# Patient Record
Sex: Male | Born: 1970
Health system: Southern US, Community
[De-identification: ages and names within clinical notes are randomized; demographics above are authoritative.]

## PROBLEM LIST (undated history)

## (undated) DIAGNOSIS — T7840XA Allergy, unspecified, initial encounter: Secondary | ICD-10-CM

## (undated) DIAGNOSIS — G709 Myoneural disorder, unspecified: Secondary | ICD-10-CM

## (undated) DIAGNOSIS — M1990 Unspecified osteoarthritis, unspecified site: Secondary | ICD-10-CM

## (undated) HISTORY — DX: Allergy, unspecified, initial encounter: T78.40XA

## (undated) HISTORY — DX: Myoneural disorder, unspecified: G70.9

## (undated) HISTORY — DX: Unspecified osteoarthritis, unspecified site: M19.90

## (undated) HISTORY — PX: VASECTOMY: SHX75

---

## 1989-06-26 HISTORY — PX: KNEE ARTHROSCOPY: SUR90

## 2016-10-31 ENCOUNTER — Ambulatory Visit: Payer: Self-pay | Admitting: Podiatry

## 2016-12-14 DIAGNOSIS — M25511 Pain in right shoulder: Secondary | ICD-10-CM | POA: Diagnosis not present

## 2016-12-14 DIAGNOSIS — M797 Fibromyalgia: Secondary | ICD-10-CM | POA: Diagnosis not present

## 2016-12-15 MED FILL — GABAPENTIN 300 MG CAPSULE: 300 | 30 days supply | Qty: 60 | Fill #0

## 2016-12-26 DIAGNOSIS — S61201A Unspecified open wound of left index finger without damage to nail, initial encounter: Secondary | ICD-10-CM | POA: Diagnosis not present

## 2016-12-26 DIAGNOSIS — S61203A Unspecified open wound of left middle finger without damage to nail, initial encounter: Secondary | ICD-10-CM | POA: Diagnosis not present

## 2017-01-05 DIAGNOSIS — S61201D Unspecified open wound of left index finger without damage to nail, subsequent encounter: Secondary | ICD-10-CM | POA: Diagnosis not present

## 2017-01-05 DIAGNOSIS — S61203A Unspecified open wound of left middle finger without damage to nail, initial encounter: Secondary | ICD-10-CM | POA: Diagnosis not present

## 2017-03-02 ENCOUNTER — Ambulatory Visit (HOSPITAL_COMMUNITY)
Admission: EM | Admit: 2017-03-02 | Discharge: 2017-03-02 | Disposition: A | Discharge status: Home / Self Care | Payer: 59 | Attending: Emergency Medicine | Admitting: Emergency Medicine

## 2017-03-02 ENCOUNTER — Encounter (HOSPITAL_COMMUNITY): Payer: Self-pay | Admitting: *Deleted

## 2017-03-02 ENCOUNTER — Ambulatory Visit (INDEPENDENT_AMBULATORY_CARE_PROVIDER_SITE_OTHER): Payer: 59

## 2017-03-02 DIAGNOSIS — M25511 Pain in right shoulder: Secondary | ICD-10-CM

## 2017-03-02 DIAGNOSIS — G8929 Other chronic pain: Secondary | ICD-10-CM

## 2017-03-02 MED ORDER — DICLOFENAC SODIUM 75 MG PO TBEC: 75.0000 mg | DELAYED_RELEASE_TABLET | Freq: Two times a day (BID) | ORAL | 0 refills | Status: DC

## 2017-03-02 MED FILL — DICLOFENAC SODIUM 75 MG TAB: 75 | 15 days supply | Qty: 30 | Fill #0

## 2017-03-02 NOTE — ED Provider Notes (Signed)
HPI  SUBJECTIVE:  Maurice Anderson is a 46 y.o. male who presents with waxing and waning right shoulder pain since May. Thinks that it started after playing with his daughter, throwing her up into the air. He notes a knot on his right shoulder. He describes this pain as stabbing, sharp, intermittent, lasting hours states that it flares from time to time after use. He denies trauma. He denies any grip weakness, arm weakness, distal numbness or tingling, bruising, erythema, fevers, sensation of instability. He saw his primary care physician, received a steroid injection and has tried resting his shoulder. States that he has not had any imaging thus far. Symptoms are better with rest. Symptoms are worse with urinating seeing his shoulder for prolonged period of time, abduction past 90. No previous history right shoulder injury, kidney disease, diabetes, hypertension. ZOX:WRUEAVW, Loraine Leriche, DO     History reviewed. No pertinent past medical history.  History reviewed. No pertinent surgical history.  History reviewed. No pertinent family history.  Social History  Substance Use Topics  . Smoking status: Current Every Day Smoker  . Smokeless tobacco: Never Used  . Alcohol use No    No current facility-administered medications for this encounter.   Current Outpatient Prescriptions:  .  diclofenac (VOLTAREN) 75 MG EC tablet, Take 1 tablet (75 mg total) by mouth 2 (two) times daily. Take with food, Disp: 30 tablet, Rfl: 0  No Known Allergies   ROS  As noted in HPI.   Physical Exam  BP (!) 143/94 (BP Location: Left Arm)   Pulse (!) 58   Temp 98 F (36.7 C) (Oral)   Resp 16   SpO2 100%   Constitutional: Well developed, well nourished, no acute distress Eyes:  EOMI, conjunctiva normal bilaterally HENT: Normocephalic, atraumatic,mucus membranes moist Respiratory: Normal inspiratory effort Cardiovascular: Normal rate GI: nondistended skin: No rash, skin intact Musculoskeletal:  R  shoulder with ROM normal, Drop test normal , clavicle NT, A/C joint tender And very prominent compared to the other one, proximal humerus NT, , Motor strength normal, Sensation intact LT over deltoid region, distal NVI with hand on affected side having grossly intact sensation and strength in the distribution of the median, radial, and ulnar nerve.  no pain with internal rotation, pain with external rotation, negative tenderness in bicipital groove, positive empty can test , negative liftoff test,  negative "popeye" sign, no instability with abduction/external rotation. Neurologic: Alert & oriented x 3, no focal neuro deficits Psychiatric: Speech and behavior appropriate   ED Course   Medications - No data to display  Orders Placed This Encounter  Procedures  . DG Shoulder Right    Standing Status:   Standing    Number of Occurrences:   1    Order Specific Question:   Reason for Exam (SYMPTOM  OR DIAGNOSIS REQUIRED)    Answer:   r shoulder pain x 6-8 weeks    No results found for this or any previous visit (from the past 24 hour(s)). Dg Shoulder Right  Result Date: 03/02/2017 CLINICAL DATA:  Initial evaluation for right shoulder pain for 6-8 weeks. EXAM: RIGHT SHOULDER - 2+ VIEW COMPARISON:  None. FINDINGS: There is no evidence of fracture or dislocation. There is no evidence of arthropathy or other focal bone abnormality. Soft tissues are unremarkable. IMPRESSION: Normal radiograph of the right shoulder. Electronically Signed   By: Rise Mu M.D.   On: 03/02/2017 14:25    ED Clinical Impression  Chronic right shoulder pain  ED Assessment/Plan  We'll get imaging to rule out any acute changes, however, favor A/C joint arthritis/pain. Doubt AC joint separation. If x-ray is negative, we'll refer to Dr. Rennis ChrisSupple, orthopedic surgeon., Diclofenac 75 mg twice a day with 1 g of Tylenol. Ice shoulder after use.  Imaging independently reviewed. Normal shoulder. See radiology report  for details.  Plan as above.  Discussed  imaging, MDM, plan and followup with patient. Patient agrees with plan.   Meds ordered this encounter  Medications  . DISCONTD: naproxen sodium (ANAPROX) 220 MG tablet    Sig: Take 220 mg by mouth 2 (two) times daily with a meal.  . diclofenac (VOLTAREN) 75 MG EC tablet    Sig: Take 1 tablet (75 mg total) by mouth 2 (two) times daily. Take with food    Dispense:  30 tablet    Refill:  0    *This clinic note was created using Scientist, clinical (histocompatibility and immunogenetics)Dragon dictation software. Therefore, there may be occasional mistakes despite careful proofreading.  ?   Domenick GongMortenson, Kaily Wragg, MD 03/03/17 [phone]

## 2017-03-02 NOTE — Discharge Instructions (Signed)
That the diclofenac twice a day with 1 g of Tylenol. This is a very effective combination for pain. Follow-up with Dr. Rennis ChrisSupple.

## 2017-03-02 NOTE — ED Triage Notes (Signed)
Patient reports right shoulder pain x 6-8 weeks. States saw PCP and was given steriod shot but did not relieve the pain. Patient reports there is a knot in shoulder now. Would like xray done, was not done at PCP. Denies any injury.

## 2017-04-17 NOTE — Progress Notes (Signed)
Maurice ScaleZach Jaree Anderson D.O. [facility] [address] [place], [place] [address] Phone: [phone] Subjective:    I'm seeing this patient by the request  of:  Lorelei PontMahoney, Mark, DO   CC: Right shoulder pain  BJY:NWGNFAOZHYHPI:Subjective  Maurice RiggsMichael Anderson is a 46 y.o. male coming in with complaint of right shoulder pain. Patient is had this pain since May. Started playing with his daughter when he lifted her up in the air and felt an audible pop and pain. Has a sharp, stabbing sensation that seems to occur in last hours. Denies any loss of strength or any arm weakness or any numbness. Patient states that he saw his primary care physician and was given an injection at one point. Seems to get better with rest. Rates the severity of pain a 7 out of 10  Patient did have a x-ray done on 03/02/2017. This was independently visualized by me. No bony abnormality.  Says he has a knot on the top of his shoulder. Taking his shirt off, pulling, and putting his hand behind his back is painful. Certain movements are painful. Pain is in the anterior shoulder. He says he usually braces his shoulder when it gets tired. Says he feels a burning feeling.    No past medical history on file. No past surgical history on file. Social History   Social History  . Marital status: Married    Spouse name: N/A  . Number of children: N/A  . Years of education: N/A   Social History Main Topics  . Smoking status: Current Every Day Smoker  . Smokeless tobacco: Never Used  . Alcohol use No  . Drug use: Unknown  . Sexual activity: Not on file   Other Topics Concern  . Not on file   Social History Narrative  . No narrative on file   No Known Allergies No family history on file.   Past medical history, social, surgical and family history all reviewed in electronic medical record.  No pertanent information unless stated regarding to the chief complaint.   Review of Systems:Review of systems updated and as accurate as of  04/17/17  No headache, visual changes, nausea, vomiting, diarrhea, constipation, dizziness, abdominal pain, skin rash, fevers, chills, night sweats, weight loss, swollen lymph nodes, body aches, joint swelling, muscle aches, chest pain, shortness of breath, mood changes.   Objective  There were no vitals taken for this visit. Systems examined below as of 04/17/17   General: No apparent distress alert and oriented x3 mood and affect normal, dressed appropriately.  HEENT: Pupils equal, extraocular movements intact  Respiratory: Patient's speak in full sentences and does not appear short of breath  Cardiovascular: No lower extremity edema, non tender, no erythema  Skin: Warm dry intact with no signs of infection or rash on extremities or on axial skeleton.  Abdomen: Soft nontender  Neuro: Cranial nerves II through XII are intact, neurovascularly intact in all extremities with 2+ DTRs and 2+ pulses.  Lymph: No lymphadenopathy of posterior or anterior cervical chain or axillae bilaterally.  Gait normal with good balance and coordination.  MSK:  Non tender with full range of motion and good stability and symmetric strength and tone of  elbows, wrist, hip, knee and ankles bilaterally.  Shoulder: Right Inspection reveals no abnormalities, atrophy or asymmetry. Palpation shows diffuse  ROM is full in all planes passively. Rotator cuff strength normal throughout. signs of impingement with positive Neer and Hawkin's tests, but negative empty can sign. Positive crossover Speeds  and Yergason's tests normal. No labral pathology noted with negative Obrien's, negative clunk and good stability. Normal scapular function observed. No painful arc and no drop arm sign. No apprehension sign  MSK US performed of: Right This study was ordered, performed, and interpreted by Terrilee Files D.O.  Shoulder:   Supraspinatus:  Rotator cuff tear noted. Scar tissue formation noted. Mild increase in Doppler  flow Subscapularis:  Appears normal on long and transverse views. Positive bursa Teres Minor:  Appears normal on long and transverse views. AC joint:  Severe arthritis Glenohumeral Joint:  Appears normal without effusion. Glenoid Labrum:  Intact without visualized tears. Biceps Tendon:  Appears normal on long and transverse views, no fraying of tendon, tendon located in intertubercular groove, no subluxation with shoulder internal or external rotation.  Impression: Rotator cuff tear no retraction  97110; 15 additional minutes spent for Therapeutic exercises as stated in above notes.  This included exercises focusing on stretching, strengthening, with significant focus on eccentric aspects.   Long term goals include an improvement in range of motion, strength, endurance as well as avoiding reinjury. Patient's frequency would include in 1-2 times a day, 3-5 times a week for a duration of 6-12 weeks. Shoulder Exercises that included:  Basic scapular stabilization to include adduction and depression of scapula Scaption, focusing on proper movement and good control Internal and External rotation utilizing a theraband, with elbow tucked at side entire time Rows with theraband which was given   Proper technique shown and discussed handout in great detail with ATC.  All questions were discussed and answered.      Impression and Recommendations:     This case required medical decision making of moderate complexity.      Note: This dictation was prepared with Dragon dictation along with smaller phrase technology. Any transcriptional errors that result from this process are unintentional.

## 2017-04-18 ENCOUNTER — Ambulatory Visit: Payer: Self-pay

## 2017-04-18 ENCOUNTER — Ambulatory Visit (INDEPENDENT_AMBULATORY_CARE_PROVIDER_SITE_OTHER): Payer: 59 | Admitting: Family Medicine

## 2017-04-18 ENCOUNTER — Encounter: Payer: Self-pay | Admitting: Family Medicine

## 2017-04-18 VITALS — BP 130/70 | HR 96 | Ht 73.0 in | Wt 191.0 lb

## 2017-04-18 DIAGNOSIS — M75100 Unspecified rotator cuff tear or rupture of unspecified shoulder, not specified as traumatic: Secondary | ICD-10-CM | POA: Insufficient documentation

## 2017-04-18 DIAGNOSIS — G8929 Other chronic pain: Secondary | ICD-10-CM | POA: Diagnosis not present

## 2017-04-18 DIAGNOSIS — M25511 Pain in right shoulder: Secondary | ICD-10-CM

## 2017-04-18 DIAGNOSIS — M19011 Primary osteoarthritis, right shoulder: Secondary | ICD-10-CM

## 2017-04-18 DIAGNOSIS — M19019 Primary osteoarthritis, unspecified shoulder: Secondary | ICD-10-CM | POA: Insufficient documentation

## 2017-04-18 DIAGNOSIS — M75111 Incomplete rotator cuff tear or rupture of right shoulder, not specified as traumatic: Secondary | ICD-10-CM

## 2017-04-18 MED ORDER — NITROGLYCERIN 0.2 MG/HR TD PT24: MEDICATED_PATCH | TRANSDERMAL | 1 refills | Status: DC

## 2017-04-18 MED ORDER — DICLOFENAC SODIUM 2 % TD SOLN: 2.0000 "application " | Freq: Two times a day (BID) | TRANSDERMAL | 3 refills | Status: DC

## 2017-04-18 MED FILL — NITROGLYCERIN 0.2 MG/HR PTC: 0.2 | 88 days supply | Qty: 22 | Fill #0

## 2017-04-18 NOTE — Assessment & Plan Note (Signed)
Patient does have rotator cuff tear. 5 months. No significant retraction. Discussed with patient at great length and will start home exercises, patient work with Event organiserathletic trainer, we discussed icing regimen. Started medical question and warned of potential side effects. Patient will continue to be active otherwise. Avoid heavy lifting. Follow-up again in 4-6 weeks

## 2017-04-18 NOTE — Patient Instructions (Addendum)
Good to see you  You do have a rotator cuff tear and AC arthritis.  Ice 20 minutes 2 times daily. Usually after activity and before bed. Exercises 3 times a week.  pennsaid pinkie amount topically 2 times daily as needed.   Keep hands within peripheral vision.  Vitamin D 2000 IU dialy  Turmeric 500mg  daily  See me again in 4 weeks.

## 2017-05-10 ENCOUNTER — Ambulatory Visit: Payer: 59 | Admitting: Nurse Practitioner

## 2017-09-12 ENCOUNTER — Encounter: Payer: Self-pay | Admitting: Family Medicine

## 2017-09-12 ENCOUNTER — Ambulatory Visit (INDEPENDENT_AMBULATORY_CARE_PROVIDER_SITE_OTHER): Payer: 59 | Admitting: Family Medicine

## 2017-09-12 ENCOUNTER — Encounter: Payer: 59 | Admitting: Family Medicine

## 2017-09-12 VITALS — BP 139/86 | HR 72 | Temp 97.4°F | Ht 73.0 in | Wt 191.5 lb

## 2017-09-12 DIAGNOSIS — Z91018 Allergy to other foods: Secondary | ICD-10-CM | POA: Diagnosis not present

## 2017-09-12 DIAGNOSIS — G609 Hereditary and idiopathic neuropathy, unspecified: Secondary | ICD-10-CM | POA: Diagnosis not present

## 2017-09-12 DIAGNOSIS — Z0000 Encounter for general adult medical examination without abnormal findings: Secondary | ICD-10-CM

## 2017-09-12 DIAGNOSIS — J301 Allergic rhinitis due to pollen: Secondary | ICD-10-CM | POA: Diagnosis not present

## 2017-09-12 MED ORDER — BETAMETHASONE SOD PHOS & ACET 6 (3-3) MG/ML IJ SUSP: 6.0000 mg | Freq: Once | INTRAMUSCULAR | Status: DC

## 2017-09-12 MED ORDER — FEXOFENADINE-PSEUDOEPHED ER 180-240 MG PO TB24: 1.0000 | ORAL_TABLET | Freq: Every evening | ORAL | 11 refills | Status: DC

## 2017-09-12 MED ORDER — OXYCODONE HCL 10 MG PO TABS: 10.0000 mg | ORAL_TABLET | ORAL | 0 refills | Status: DC | PRN

## 2017-09-12 MED FILL — oxyCODONE HCL 10 MG TABS: 10 | 2 days supply | Qty: 12 | Fill #0

## 2017-09-12 NOTE — Progress Notes (Signed)
Subjective:  Patient ID: Maurice Anderson, male    DOB: 1970/07/26  Age: 47 y.o. MRN: [id number]  CC: New Patient (Initial Visit) (pt here today for CPE for work and also c/o sinus drainage with congestion all the time since November)   HPI Maurice Anderson presents for new patient evaluation and complete physical required by work.  Additionally he has some sinus symptoms.Symptoms include congestion, facial pain, nasal congestion, non productive cough, post nasal drip and sinus pressure. There is no fever, chills, or sweats. Onset of symptoms was a few days ago, gradually worsening since that time.  Patient tells me also that he has pain from neuropathy in the abdomen.  He says that it radiates from the xiphoid region all the way to the suprapubic region and down to the head of his penis.  It will recur 4-6 times per year.  Peaks at 10/10 for about an hour and then remits to about a 5/10 for about a day and a half after that.  He says that he will use 1 or 2 oxycodone spread out over the day and a half timeframe.  He notes that he has a bottle of that that he has had for 3 years and still has a couple of pills left.  He brings in the bottle and there are 4 pills left.  The PMP aware site for [place] shows no prescriptions filled for the last 2 years for any controlled substance for [name].  Patient reports left lower extremity pain when he exerts himself excessively.  He says that he has an old injury to the leg and was told he had sciatic nerve damage.  Apparently he had a peripheral nerve conduction velocity done but he does not know the results.  He is willing to sign to have those results sent to [place].  Additionally he reports that he has swelling in his neck from a meds given to him for right rotator cuff injury he is not sure the name of the medicine.   Depression screen PHQ 2/9 09/12/2017  Decreased Interest 0  Down, Depressed, Hopeless 0  PHQ - 2 Score 0     History Jenny has a past medical history of Allergy, Arthritis, and Neuromuscular disorder ([place]).   He has a past surgical history that includes Knee arthroscopy (Right, 1991) and Vasectomy.   His family history includes Alcohol abuse in his father; Arthritis in his paternal grandmother; Depression in his mother; Diabetes in his father and mother; Drug abuse in his mother; Heart disease in his maternal grandfather; Hyperlipidemia in his maternal grandmother and mother; Stroke in his maternal grandfather.He reports that he has been smoking.  He has never used smokeless tobacco. He reports that he does not drink alcohol or use drugs.  No current outpatient medications on file prior to visit.   No current facility-administered medications on file prior to visit.      ROS Review of Systems  Constitutional: Negative for chills, diaphoresis, fever and unexpected weight change.  HENT: Negative for congestion, hearing loss, rhinorrhea and sore throat.   Eyes: Negative for visual disturbance.  Respiratory: Negative for cough and shortness of breath.   Cardiovascular: Negative for chest pain.  Gastrointestinal: Positive for abdominal pain. Negative for constipation and diarrhea.  Genitourinary: Negative for dysuria and flank pain.  Musculoskeletal: Positive for arthralgias, myalgias and neck pain. Negative for joint swelling.  Skin: Negative for rash.  Allergic/Immunologic: Positive for food allergies (Patient is not sure that  he is allergic to me but he notices that every time he tries to eat red meat for the last year he gets nauseous and cannot finish the meal.  He is concerned that perhaps he is been bit by a tick and developed the meat allergy known).  Neurological: Negative for dizziness and headaches.  Psychiatric/Behavioral: Negative for dysphoric mood and sleep disturbance.    Objective:  BP 139/86   Pulse 72   Temp (!) 97.4 F (36.3 C) (Oral)   Ht '6\' 1"'  (1.854 m)   Wt 191  lb 8 oz (86.9 kg)   BMI 25.27 kg/m   BP Readings from Last 3 Encounters:  09/12/17 139/86  04/18/17 130/70  03/02/17 (!) 143/94    Wt Readings from Last 3 Encounters:  09/12/17 191 lb 8 oz (86.9 kg)  04/18/17 191 lb (86.6 kg)     Physical Exam  Constitutional: He is oriented to person, place, and time. He appears well-developed and well-nourished.  HENT:  Head: Normocephalic and atraumatic.  Mouth/Throat: Oropharynx is clear and moist.  Eyes: Pupils are equal, round, and reactive to light. EOM are normal.  Neck: Normal range of motion. No tracheal deviation present. No thyromegaly present.  Cardiovascular: Normal rate, regular rhythm and normal heart sounds. Exam reveals no gallop and no friction rub.  No murmur heard. Pulmonary/Chest: Breath sounds normal. He has no wheezes. He has no rales.  Abdominal: Soft. He exhibits no mass. There is no tenderness.  Musculoskeletal: Normal range of motion. He exhibits no edema.  Neurological: He is alert and oriented to person, place, and time.  Skin: Skin is warm and dry.  Psychiatric: He has a normal mood and affect.      Assessment & Plan:   [name] was seen today for new patient (initial visit).  Diagnoses and all orders for this visit:  Idiopathic peripheral neuropathy -     CBC with Differential/Platelet -     CMP14+EGFR -     Lipid panel -     PSA Total (Reflex To Free) -     TSH -     VITAMIN D 25 Hydroxy (Vit-D Deficiency, Fractures) -     Vitamin B12 -     Folate -     Magnesium -     Phosphorus -     Oxycodone HCl 10 MG TABS; Take 1 tablet (10 mg total) by mouth every 4 (four) hours as needed (severe pain of abdominal neuropathy).  Allergy to meat -     Alpha-Gal Panel  Seasonal allergic rhinitis due to pollen -     betamethasone acetate-betamethasone sodium phosphate (CELESTONE) injection 6 mg  Other orders -     fexofenadine-pseudoephedrine (ALLEGRA-D 24) 180-240 MG 24 hr tablet; Take 1 tablet by mouth  every evening. For allergy and congestion       I have discontinued Skeeter Grindle's diclofenac, nitroGLYCERIN, and Diclofenac Sodium. I have also changed his Oxycodone HCl. Additionally, I am having him start on fexofenadine-pseudoephedrine. We will continue to administer betamethasone acetate-betamethasone sodium phosphate.  Allergies as of 09/12/2017   No Known Allergies     Medication List        Accurate as of 09/12/17 11:59 PM. Always use your most recent med list.          fexofenadine-pseudoephedrine 180-240 MG 24 hr tablet Commonly known as:  ALLEGRA-D 24 Take 1 tablet by mouth every evening. For allergy and congestion   Oxycodone HCl 10 MG Tabs Take  1 tablet (10 mg total) by mouth every 4 (four) hours as needed (severe pain of abdominal neuropathy).        Follow-up: Return in about 1 year (around 09/13/2018).  Claretta Fraise, M.D.

## 2017-09-17 LAB — CBC WITH DIFFERENTIAL/PLATELET
Basophils Absolute: 0 10*3/uL (ref 0.0–0.2)
Basos: 0 %
EOS (ABSOLUTE): 0.1 10*3/uL (ref 0.0–0.4)
EOS: 1 %
HEMATOCRIT: 42.7 % (ref 37.5–51.0)
Hemoglobin: 14.6 g/dL (ref 13.0–17.7)
IMMATURE GRANS (ABS): 0 10*3/uL (ref 0.0–0.1)
IMMATURE GRANULOCYTES: 0 %
LYMPHS: 18 %
Lymphocytes Absolute: 2.2 10*3/uL (ref 0.7–3.1)
MCH: 31.8 pg (ref 26.6–33.0)
MCHC: 34.2 g/dL (ref 31.5–35.7)
MCV: 93 fL (ref 79–97)
Monocytes Absolute: 0.9 10*3/uL (ref 0.1–0.9)
Monocytes: 8 %
NEUTROS PCT: 73 %
Neutrophils Absolute: 9 10*3/uL — ABNORMAL HIGH (ref 1.4–7.0)
PLATELETS: 275 10*3/uL (ref 150–379)
RBC: 4.59 x10E6/uL (ref 4.14–5.80)
RDW: 13.9 % (ref 12.3–15.4)
WBC: 12.4 10*3/uL — AB (ref 3.4–10.8)

## 2017-09-17 LAB — CMP14+EGFR
A/G RATIO: 2 (ref 1.2–2.2)
ALT: 20 IU/L (ref 0–44)
AST: 22 IU/L (ref 0–40)
Albumin: 4.5 g/dL (ref 3.5–5.5)
Alkaline Phosphatase: 80 IU/L (ref 39–117)
BUN/Creatinine Ratio: 15 (ref 9–20)
BUN: 15 mg/dL (ref 6–24)
Bilirubin Total: 0.2 mg/dL (ref 0.0–1.2)
CALCIUM: 9.3 mg/dL (ref 8.7–10.2)
CO2: 25 mmol/L (ref 20–29)
CREATININE: 1.01 mg/dL (ref 0.76–1.27)
Chloride: 105 mmol/L (ref 96–106)
GFR calc non Af Amer: 88 mL/min/{1.73_m2} (ref >59)
GFR, EST AFRICAN AMERICAN: 101 mL/min/{1.73_m2} (ref >59)
Globulin, Total: 2.2 g/dL (ref 1.5–4.5)
Glucose: 105 mg/dL — ABNORMAL HIGH (ref 65–99)
Potassium: 4.7 mmol/L (ref 3.5–5.2)
Sodium: 143 mmol/L (ref 134–144)
TOTAL PROTEIN: 6.7 g/dL (ref 6.0–8.5)

## 2017-09-17 LAB — TSH: TSH: 0.89 u[IU]/mL (ref 0.450–4.500)

## 2017-09-17 LAB — LIPID PANEL
Chol/HDL Ratio: 7.1 ratio — ABNORMAL HIGH (ref 0.0–5.0)
Cholesterol, Total: 198 mg/dL (ref 100–199)
HDL: 28 mg/dL — ABNORMAL LOW (ref >39)
LDL CALC: 123 mg/dL — AB (ref 0–99)
TRIGLYCERIDES: 233 mg/dL — AB (ref 0–149)
VLDL CHOLESTEROL CAL: 47 mg/dL — AB (ref 5–40)

## 2017-09-17 LAB — ALPHA-GAL PANEL
ALPHA GAL IGE: 0.45 kU/L — AB (ref <0.10)
BEEF (BOS SPP) IGE: 0.19 kU/L (ref <0.35)
Class Interpretation: 0
LAMB CLASS INTERPRETATION: 0
Pork (Sus spp) IgE: <0.1 kU/L (ref <0.35)

## 2017-09-17 LAB — PHOSPHORUS: PHOSPHORUS: 2.5 mg/dL (ref 2.5–4.5)

## 2017-09-17 LAB — PSA TOTAL (REFLEX TO FREE): PROSTATE SPECIFIC AG, SERUM: 2.4 ng/mL (ref 0.0–4.0)

## 2017-09-17 LAB — VITAMIN B12: Vitamin B-12: 406 pg/mL (ref 232–1245)

## 2017-09-17 LAB — MAGNESIUM: MAGNESIUM: 1.9 mg/dL (ref 1.6–2.3)

## 2017-09-17 LAB — FOLATE: Folate: 10.3 ng/mL (ref >3.0)

## 2017-09-17 LAB — VITAMIN D 25 HYDROXY (VIT D DEFICIENCY, FRACTURES): VIT D 25 HYDROXY: 29.7 ng/mL — AB (ref 30.0–100.0)

## 2017-09-18 ENCOUNTER — Other Ambulatory Visit: Payer: Self-pay | Admitting: *Deleted

## 2017-09-18 MED ORDER — ERGOCALCIFEROL 1.25 MG (50000 UT) PO CAPS: 50000.0000 [IU] | ORAL_CAPSULE | ORAL | 1 refills | Status: DC

## 2017-09-18 MED FILL — VIT D2 1.25 MG (50,000 UNIT: 1.25 MG | 84 days supply | Qty: 12 | Fill #0

## 2017-09-19 ENCOUNTER — Telehealth: Payer: Self-pay

## 2017-09-19 ENCOUNTER — Encounter: Payer: Self-pay | Admitting: Family Medicine

## 2017-09-19 NOTE — Telephone Encounter (Signed)
Patient aware.

## 2017-09-19 NOTE — Telephone Encounter (Signed)
-----   Message from Mechele ClaudeWarren [name], MD sent at 09/18/2017  3:50 PM EDT ----- Please tell pt. That he does not have the alpha Gal antibody. His meat reaction is unrelated to tick bite allergy. Thanks, WS

## 2017-12-12 ENCOUNTER — Ambulatory Visit: Payer: 59 | Admitting: Nurse Practitioner

## 2018-06-04 ENCOUNTER — Encounter: Payer: Self-pay | Admitting: Family Medicine

## 2018-06-04 ENCOUNTER — Ambulatory Visit (INDEPENDENT_AMBULATORY_CARE_PROVIDER_SITE_OTHER): Payer: 59

## 2018-06-04 ENCOUNTER — Ambulatory Visit (INDEPENDENT_AMBULATORY_CARE_PROVIDER_SITE_OTHER): Payer: 59 | Admitting: Family Medicine

## 2018-06-04 VITALS — BP 130/82 | HR 88 | Temp 97.5°F | Ht 73.0 in | Wt 189.4 lb

## 2018-06-04 DIAGNOSIS — M25462 Effusion, left knee: Secondary | ICD-10-CM

## 2018-06-04 DIAGNOSIS — J0100 Acute maxillary sinusitis, unspecified: Secondary | ICD-10-CM

## 2018-06-04 DIAGNOSIS — M25562 Pain in left knee: Secondary | ICD-10-CM

## 2018-06-04 MED ORDER — AMOXICILLIN-POT CLAVULANATE 875-125 MG PO TABS: 1.0000 | ORAL_TABLET | Freq: Two times a day (BID) | ORAL | 0 refills | Status: DC

## 2018-06-04 MED ORDER — PREDNISONE 10 MG PO TABS: ORAL_TABLET | ORAL | 0 refills | Status: DC

## 2018-06-04 MED FILL — predniSONE 10 MG TABS: 10 | 15 days supply | Qty: 45 | Fill #0

## 2018-06-04 NOTE — Progress Notes (Signed)
Chief Complaint  Patient presents with  . Knee Pain    left  . Facial Pain  . Cough  . Nasal Congestion    HPI  Patient presents today for Symptoms include congestion, facial pain, nasal congestion, non productive cough, post nasal drip and sinus pressure. There is no fever, chills, or sweats. Onset of symptoms was a few days ago, gradually worsening since that time.    PMH: Smoking status noted ROS: Review of Systems  Constitutional: Negative for activity change, appetite change, chills and fever.  HENT: Positive for congestion, postnasal drip, rhinorrhea and sinus pressure. Negative for ear discharge, ear pain, hearing loss, nosebleeds, sneezing and trouble swallowing.   Respiratory: Negative for chest tightness and shortness of breath.   Cardiovascular: Negative for chest pain and palpitations.  Musculoskeletal: Positive for arthralgias.  Skin: Negative for rash.     Objective: BP 130/82   Pulse 88   Temp (!) 97.5 F (36.4 C) (Oral)   Ht 6\' 1"  (1.854 m)   Wt 189 lb 6.4 oz (85.9 kg)   BMI 24.99 kg/m  Gen: NAD, alert, cooperative with exam HEENT: NCAT, EOMI, PERRL CV: RRR, good S1/S2, no murmur Resp: CTABL, no wheezes, non-labored Abd: SNTND, BS present, no guarding or organomegaly Ext: No edema, warm.  Theleft knee has full range of motion without discomfort actively and passively with the exception of valgus stress.  There is tenderness at the lateral joint line.. Gait is normal without a limp. The patella is palpable without tenderness or edema.  Lachman / anterior drawer signs are negative for signs of instability and pain free. McMurray testing reveals a typical pop for lateral pressure.  Neuro: Alert and oriented, No gross deficits  Assessment and plan:  1. Pain and swelling of left knee   2. Acute maxillary sinusitis, recurrence not specified    This likely represents a torn meniscus.  We will do a prednisone taper he is going to continue wearing his brace as  needed rest the joint and refer to Ortho if symptoms do not resolve promptly with physical therapy Meds ordered this encounter  Medications  . predniSONE (DELTASONE) 10 MG tablet    Sig: Take 5 daily for 3 days followed by 4,3,2 and 1 for 3 days each.    Dispense:  45 tablet    Refill:  0  . amoxicillin-clavulanate (AUGMENTIN) 875-125 MG tablet    Sig: Take 1 tablet by mouth 2 (two) times daily. Take all of this medication    Dispense:  20 tablet    Refill:  0    Orders Placed This Encounter  Procedures  . DG Knee 1-2 Views Left    Standing Status:   Future    Number of Occurrences:   1    Standing Expiration Date:   08/04/2019    Order Specific Question:   Reason for Exam (SYMPTOM  OR DIAGNOSIS REQUIRED)    Answer:   lateral  joint line pain    Order Specific Question:   Preferred imaging location?    Answer:   Internal  . Ambulatory referral to Physical Therapy    Referral Priority:   Routine    Referral Type:   Physical Medicine    Referral Reason:   Specialty Services Required    Requested Specialty:   Physical Therapy    Number of Visits Requested:   1    Follow up as needed.  Mechele ClaudeWarren Kelleigh Skerritt, MD

## 2018-09-28 IMAGING — DX DG SHOULDER 2+V*R*
3 series · 3 of 3 positions shown · non-contrast
Comparison: None.

CLINICAL DATA: Initial evaluation for right shoulder pain for 6-8
weeks.

EXAM:
RIGHT SHOULDER - 2+ VIEW

[shoulder ap]
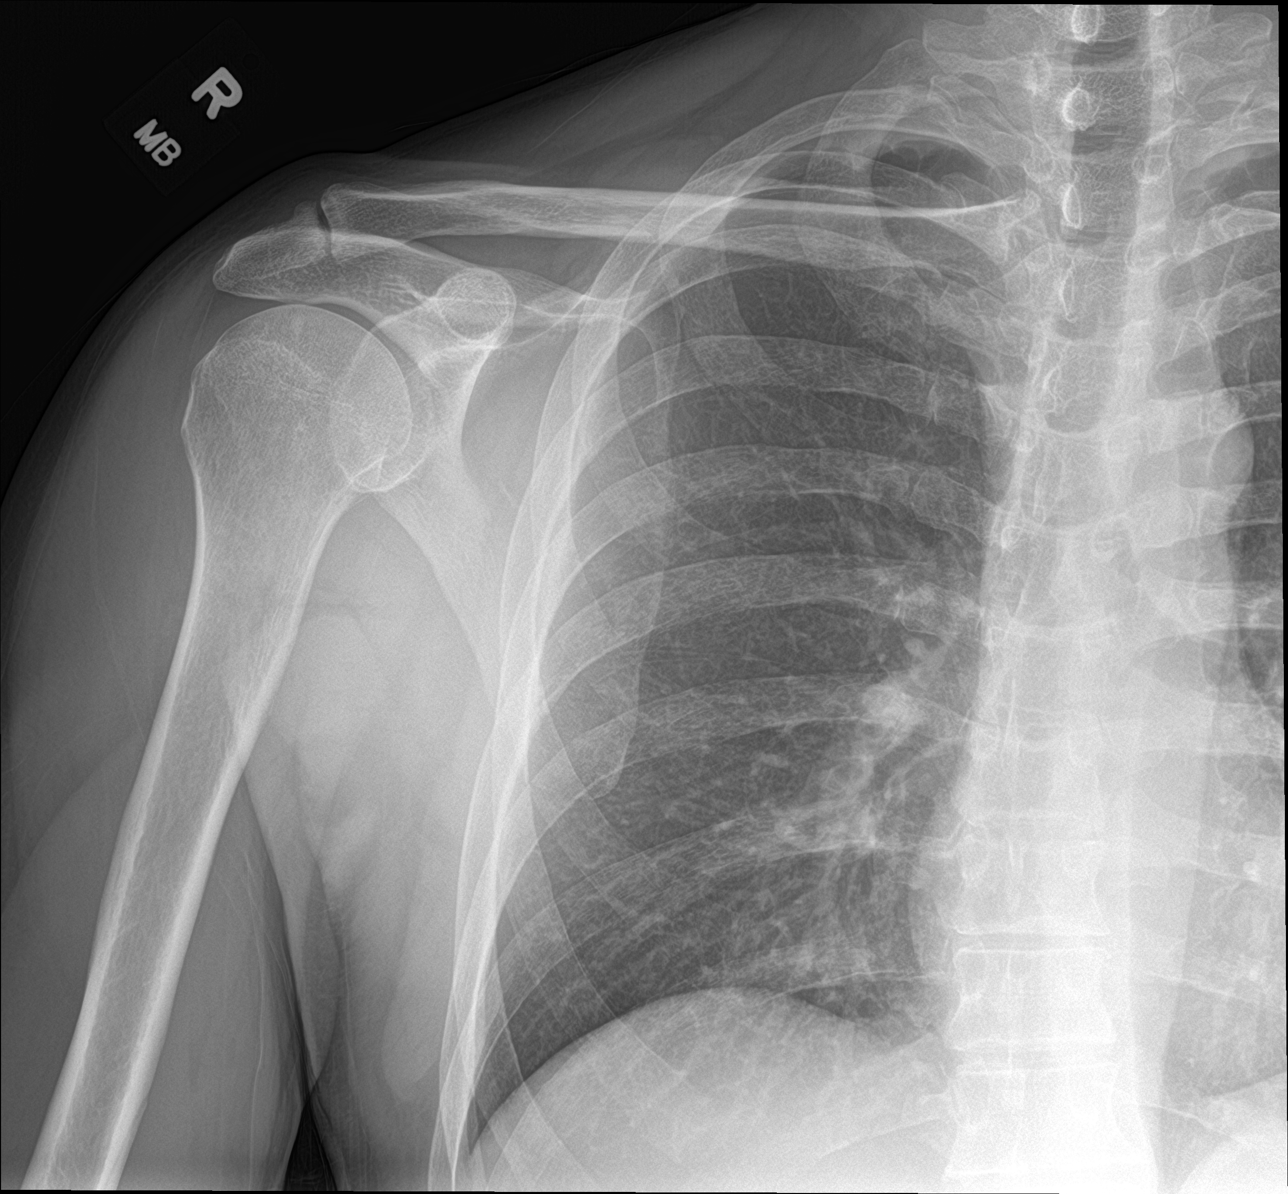

[shoulder grashey]
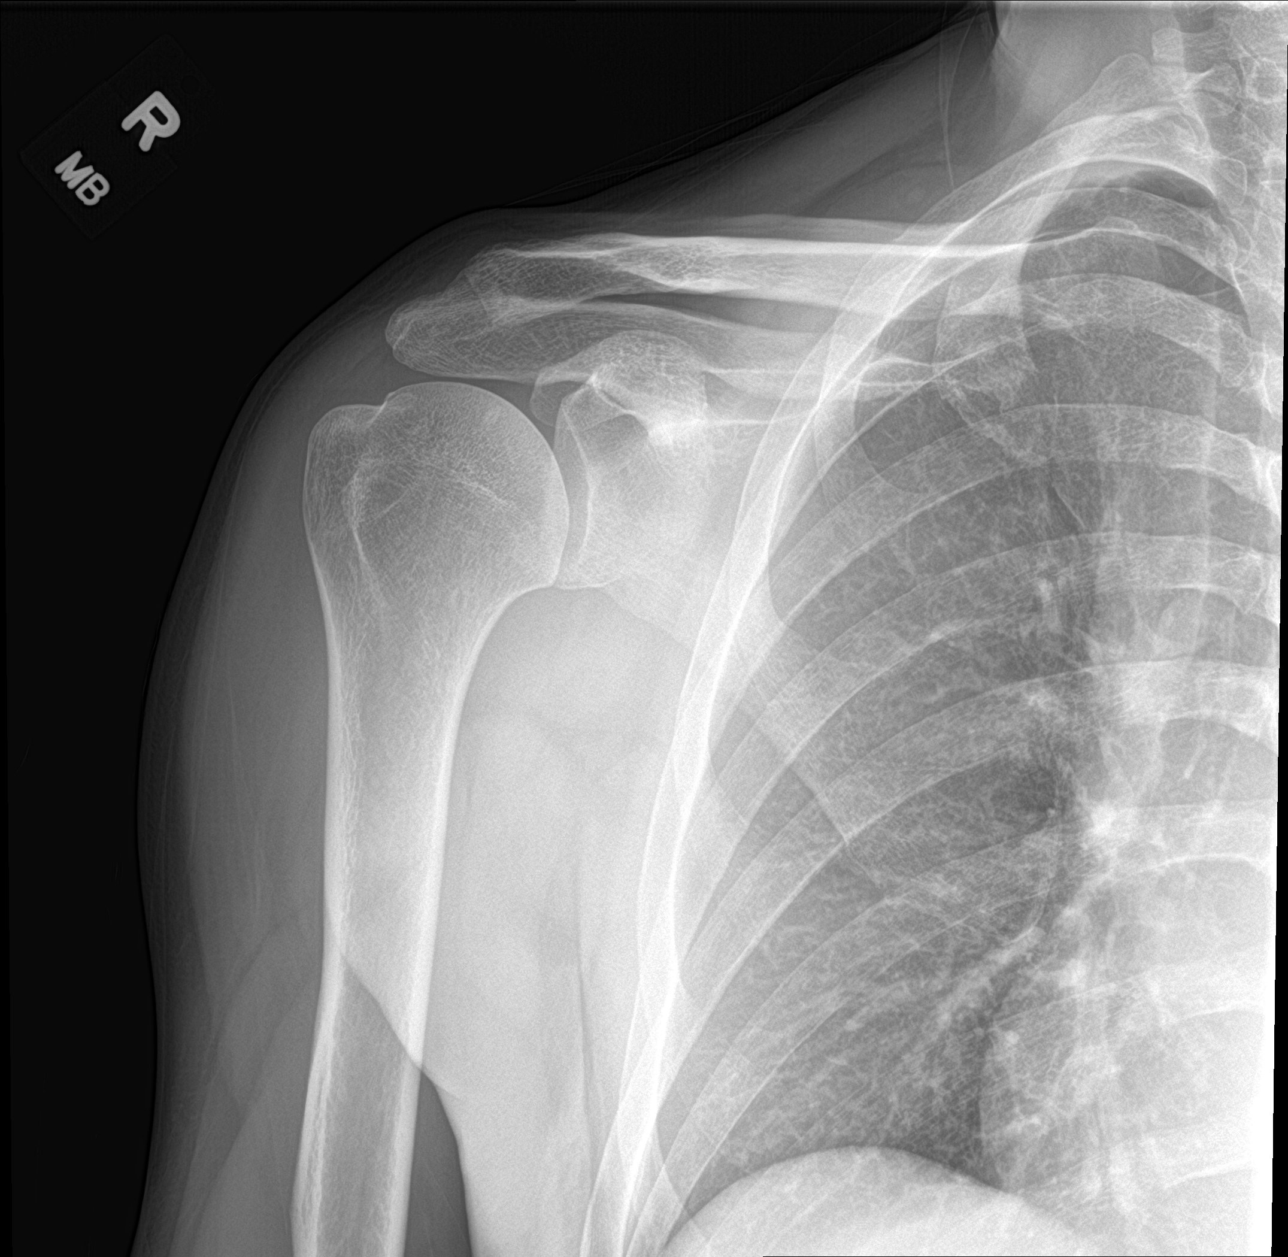

[shoulder y-view]
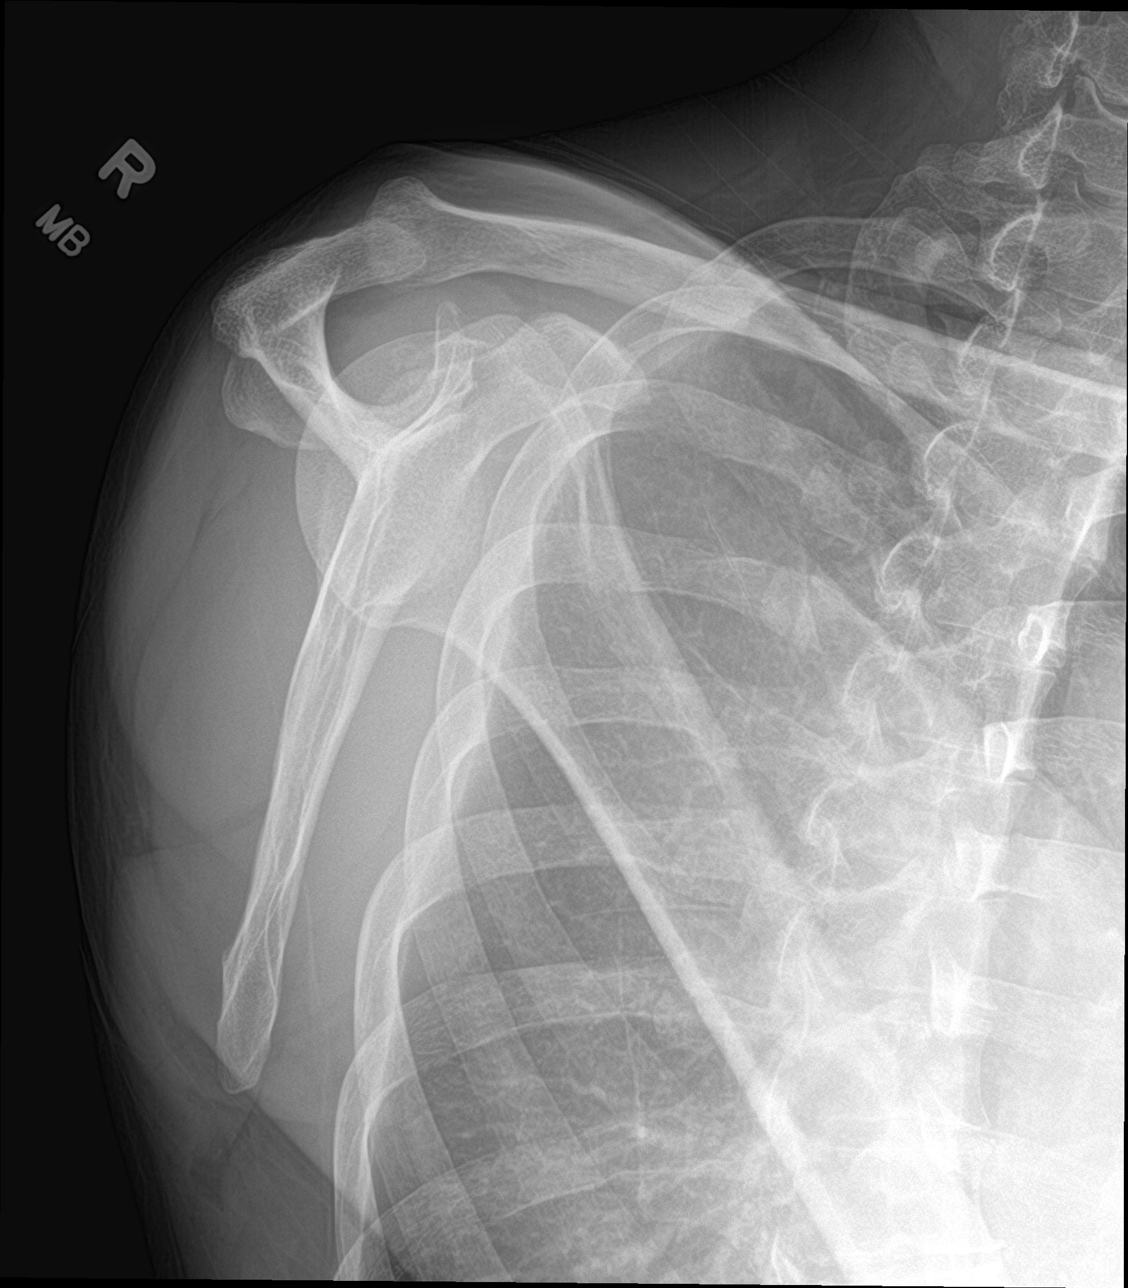

[3 of 3 positions shown; findings below may reference images not displayed]

FINDINGS: There is no evidence of fracture or dislocation. There is no
evidence of arthropathy or other focal bone abnormality. Soft
tissues are unremarkable.
IMPRESSION: Normal radiograph of the right shoulder.

## 2018-10-16 ENCOUNTER — Ambulatory Visit: Payer: 59 | Admitting: Gastroenterology

## 2018-11-11 ENCOUNTER — Telehealth: Payer: Self-pay | Admitting: General Surgery

## 2018-11-11 NOTE — Telephone Encounter (Signed)
Covid-19 travel screening questions  Have you traveled in the last 14 days? NO If yes where?  Do you now or have you had a fever in the last 14 days? NO  Do you have any respiratory symptoms of shortness of breath or cough now or in the last 14 days? NO  Do you have a medical history of Congestive Heart Failure? NO  Do you have a medical history of lung disease? NO  Do you have any family members or close contacts with diagnosed or suspected Covid-19? NO        

## 2018-11-12 ENCOUNTER — Encounter: Payer: Self-pay | Admitting: Gastroenterology

## 2018-11-12 ENCOUNTER — Ambulatory Visit: Payer: 59 | Admitting: Gastroenterology

## 2018-11-12 ENCOUNTER — Other Ambulatory Visit: Payer: Self-pay

## 2018-11-12 VITALS — BP 138/84 | HR 72 | Temp 97.6°F | Ht 73.0 in | Wt 187.0 lb

## 2018-11-12 DIAGNOSIS — K5900 Constipation, unspecified: Secondary | ICD-10-CM

## 2018-11-12 DIAGNOSIS — K921 Melena: Secondary | ICD-10-CM | POA: Diagnosis not present

## 2018-11-12 DIAGNOSIS — R1033 Periumbilical pain: Secondary | ICD-10-CM

## 2018-11-12 NOTE — Patient Instructions (Signed)
It has been recommended to you by your physician that you have a(n) Colonoscopy completed. Per your request, we did not schedule the procedure(s) today. Please contact our office at [phone] should you decide to have the procedure completed.  Take Miralax daily.   Thank you for choosing me and [facility].  Venita Lick. Pleas Koch., MD., Clementeen Graham

## 2018-11-12 NOTE — Progress Notes (Signed)
History of Present Illness: This is a 48 year old male referred by Maurice Claude, MD for the evaluation of periumbilical pain, rectal pain, constipation.  He relates a 10-year history of intermittent severe periumbilical pain that radiates to his penis and is associated with urgent need for urination.  The pain occurs about once per month.  It has not increased in severity intensity or frequency over the past several years.  He was evaluated by urology approximately 6 years ago.  He states he had imaging studies including an ultrasound and a CT scan about 8 or 10 years ago that were unremarkable.  There is been no clear cause for his pain identified.  He relates intermittent difficulties with constipation and straining sometimes with hard stools sometimes straining with softer stools.  He notes intermittent small-volume bright red rectal bleeding associated with straining and constipation for several years.  This pattern has not changed.  No prior colonoscopy.  He has an appointment with his PCP this week to include routine blood work. Denies weight loss, diarrhea, change in stool caliber, melena, nausea, vomiting, dysphagia, reflux symptoms, chest pain.      No Known Allergies Outpatient Medications Prior to Visit  Medication Sig Dispense Refill  . Oxycodone HCl 10 MG TABS Take 1 tablet (10 mg total) by mouth every 4 (four) hours as needed (severe pain of abdominal neuropathy). 12 tablet 0  . amoxicillin-clavulanate (AUGMENTIN) 875-125 MG tablet Take 1 tablet by mouth 2 (two) times daily. Take all of this medication 20 tablet 0  . ergocalciferol (VITAMIN D2) 50000 units capsule Take 1 capsule (50,000 Units total) by mouth once a week. 13 capsule 1  . fexofenadine-pseudoephedrine (ALLEGRA-D 24) 180-240 MG 24 hr tablet Take 1 tablet by mouth every evening. For allergy and congestion 30 tablet 11  . predniSONE (DELTASONE) 10 MG tablet Take 5 daily for 3 days followed by 4,3,2 and 1 for 3 days each.  45 tablet 0   No facility-administered medications prior to visit.    Past Medical History:  Diagnosis Date  . Allergy   . Arthritis    right shoulder  . Neuromuscular disorder (HCC)    nerve pain in left leg and feet   Past Surgical History:  Procedure Laterality Date  . KNEE ARTHROSCOPY Right 1991  . VASECTOMY     Social History   Socioeconomic History  . Marital status: Married    Spouse name: Not on file  . Number of children: Not on file  . Years of education: Not on file  . Highest education level: Not on file  Occupational History  . Not on file  Social Needs  . Financial resource strain: Not on file  . Food insecurity:    Worry: Not on file    Inability: Not on file  . Transportation needs:    Medical: Not on file    Non-medical: Not on file  Tobacco Use  . Smoking status: Current Every Day Smoker  . Smokeless tobacco: Never Used  Substance and Sexual Activity  . Alcohol use: No  . Drug use: No  . Sexual activity: Yes    Birth control/protection: None  Lifestyle  . Physical activity:    Days per week: Not on file    Minutes per session: Not on file  . Stress: Not on file  Relationships  . Social connections:    Talks on phone: Not on file    Gets together: Not on file  Attends religious service: Not on file    Active member of club or organization: Not on file    Attends meetings of clubs or organizations: Not on file    Relationship status: Not on file  Other Topics Concern  . Not on file  Social History Narrative  . Not on file   Family History  Problem Relation Age of Onset  . Depression Mother   . Diabetes Mother   . Drug abuse Mother   . Hyperlipidemia Mother   . Alcohol abuse Father   . Diabetes Father   . Hyperlipidemia Maternal Grandmother   . Heart disease Maternal Grandfather   . Stroke Maternal Grandfather   . Arthritis Paternal Grandmother        Review of Systems: Pertinent positive and negative review of systems were  noted in the above HPI section. All other review of systems were otherwise negative.    Physical Exam: General: Well developed, well nourished, no acute distress Head: Normocephalic and atraumatic Eyes:  sclerae anicteric, EOMI Ears: Normal auditory acuity Mouth: No deformity or lesions Neck: Supple, no masses or thyromegaly Lungs: Clear throughout to auscultation Heart: Regular rate and rhythm; no murmurs, rubs or bruits Abdomen: Soft, non tender and non distended. No masses, hepatosplenomegaly or hernias noted. Normal Bowel sounds Rectal: Deferred to colonoscopy Musculoskeletal: Symmetrical with no gross deformities  Skin: No lesions on visible extremities Pulses:  Normal pulses noted Extremities: No clubbing, cyanosis, edema or deformities noted Neurological: Alert oriented x 4, grossly nonfocal Cervical Nodes:  No significant cervical adenopathy Inguinal Nodes: No significant inguinal adenopathy Psychological:  Alert and cooperative. Normal mood and affect   Assessment and Recommendations:  1.  Intermittent hematochezia, intermittent constipation and straining.  Increase daily water intake.  Increase daily fiber intake.  MiraLAX one half scoop to 1 scoop daily titrated for complete bowel movement daily without straining.  Rule out colorectal neoplasms and other disorders.  Schedule colonoscopy. Pt states he will discuss with his wife and call back to schedule. The risks (including bleeding, perforation, infection, missed lesions, medication reactions and possible hospitalization or surgery if complications occur), benefits, and alternatives to colonoscopy with possible biopsy and possible polypectomy were discussed with the patient and they consent to proceed.   2.  Intermittent severe periumbilical abdominal pain radiating to the penis associated with urgent urination. Consider urology reevaluation after optimizing management of constipation and completing colonoscopy.   cc:  Maurice ClaudeStacks, [name], MD [address] [place], [place] [id number]

## 2018-11-13 ENCOUNTER — Ambulatory Visit (INDEPENDENT_AMBULATORY_CARE_PROVIDER_SITE_OTHER): Payer: 59 | Admitting: Family Medicine

## 2018-11-13 ENCOUNTER — Encounter: Payer: Self-pay | Admitting: Family Medicine

## 2018-11-13 VITALS — BP 142/80 | HR 65 | Temp 97.9°F | Ht 73.0 in | Wt 194.0 lb

## 2018-11-13 DIAGNOSIS — G609 Hereditary and idiopathic neuropathy, unspecified: Secondary | ICD-10-CM

## 2018-11-13 DIAGNOSIS — Z0000 Encounter for general adult medical examination without abnormal findings: Secondary | ICD-10-CM | POA: Diagnosis not present

## 2018-11-13 DIAGNOSIS — E756 Lipid storage disorder, unspecified: Secondary | ICD-10-CM | POA: Diagnosis not present

## 2018-11-13 DIAGNOSIS — E8809 Other disorders of plasma-protein metabolism, not elsewhere classified: Secondary | ICD-10-CM

## 2018-11-13 DIAGNOSIS — Z0001 Encounter for general adult medical examination with abnormal findings: Secondary | ICD-10-CM

## 2018-11-13 LAB — URINALYSIS
Bilirubin, UA: NEGATIVE
Glucose, UA: NEGATIVE
Ketones, UA: NEGATIVE
Leukocytes,UA: NEGATIVE
Nitrite, UA: NEGATIVE
Protein,UA: NEGATIVE
Specific Gravity, UA: 1.02 (ref 1.005–1.030)
Urobilinogen, Ur: 0.2 mg/dL (ref 0.2–1.0)
pH, UA: 6 (ref 5.0–7.5)

## 2018-11-13 MED ORDER — OXYCODONE HCL 10 MG PO TABS
10.0000 mg | ORAL_TABLET | ORAL | 0 refills | Status: DC | PRN
Start: 2018-11-13 — End: 2020-11-09

## 2018-11-13 MED ORDER — ERGOCALCIFEROL 1.25 MG (50000 UT) PO CAPS: 50000.0000 [IU] | ORAL_CAPSULE | ORAL | 1 refills | Status: DC

## 2018-11-13 MED ORDER — PREGABALIN 50 MG PO CAPS: ORAL_CAPSULE | ORAL | 0 refills | Status: DC

## 2018-11-13 MED FILL — oxyCODONE HCL 10 MG TABS: 10 | 2 days supply | Qty: 12 | Fill #0

## 2018-11-13 MED FILL — VIT D2 1.25 MG (50,000 UNIT: 1.25 MG | 90 days supply | Qty: 13 | Fill #0

## 2018-11-13 MED FILL — PREGABALIN 50 MG CAPS: 50 | 40 days supply | Qty: 120 | Fill #0

## 2018-11-13 NOTE — Progress Notes (Signed)
Subjective:  Patient ID: Maurice Anderson, male    DOB: 07-11-1970  Age: 48 y.o. MRN: [id number]  CC: Annual Exam   HPI [name] presents for annual physical exam.  He is still having episodes of pain rated 10/10 occurring about anywhere from once a week to about once a month.  This i has been ongoing for many years.  Pain is really stable with regard to character and duration.  It radiates from the xiphoid to the pubic region along the midline.  He also has a numbing and burning in his feet at times.  It is not temporally related and the symptoms are somewhat different from the abdominal discomfort  Depression screen [facility] 2/9 11/13/2018 09/12/2017  Decreased Interest 0 0  Down, Depressed, Hopeless 0 0  PHQ - 2 Score 0 0  Altered sleeping 0 -  Tired, decreased energy 0 -  Change in appetite 0 -  Feeling bad or failure about yourself  0 -  Trouble concentrating 0 -  Moving slowly or fidgety/restless 0 -  Suicidal thoughts 0 -  PHQ-9 Score 0 -    History Bruk has a past medical history of Allergy, Arthritis, and Neuromuscular disorder ([place]).   He has a past surgical history that includes Knee arthroscopy (Right, 1991) and Vasectomy.   His family history includes Alcohol abuse in his father; Arthritis in his paternal grandmother; Depression in his mother; Diabetes in his father and mother; Drug abuse in his mother; Heart disease in his maternal grandfather; Hyperlipidemia in his maternal grandmother and mother; Stroke in his maternal grandfather.He reports that he has been smoking. He has never used smokeless tobacco. He reports that he does not drink alcohol or use drugs.    ROS Review of Systems  Constitutional: Negative for activity change, fatigue, fever and unexpected weight change.  HENT: Negative for congestion, ear pain, hearing loss, postnasal drip and trouble swallowing.   Eyes: Negative for pain and visual disturbance.  Respiratory: Negative for cough, chest  tightness and shortness of breath.   Cardiovascular: Negative for chest pain, palpitations and leg swelling.  Gastrointestinal: Negative for abdominal distention, abdominal pain, blood in stool, constipation, diarrhea, nausea and vomiting.  Endocrine: Negative for cold intolerance, heat intolerance and polydipsia.  Genitourinary: Negative for difficulty urinating, dysuria, flank pain, frequency and urgency.  Musculoskeletal: Negative for arthralgias and joint swelling.  Skin: Negative for color change, rash and wound.  Neurological: Positive for numbness. Negative for dizziness, syncope, speech difficulty, weakness, light-headedness and headaches.  Hematological: Does not bruise/bleed easily.  Psychiatric/Behavioral: Negative for confusion, decreased concentration, dysphoric mood and sleep disturbance. The patient is not nervous/anxious.     Objective:  BP (!) 142/80   Pulse 65   Temp 97.9 F (36.6 C) (Oral)   Ht '6\' 1"'$  (1.854 m)   Wt 194 lb (88 kg)   BMI 25.60 kg/m   BP Readings from Last 3 Encounters:  11/13/18 (!) 142/80  11/12/18 138/84  06/04/18 130/82    Wt Readings from Last 3 Encounters:  11/13/18 194 lb (88 kg)  11/12/18 187 lb (84.8 kg)  06/04/18 189 lb 6.4 oz (85.9 kg)     Physical Exam Constitutional:      Appearance: He is well-developed.  HENT:     Head: Normocephalic and atraumatic.  Eyes:     Pupils: Pupils are equal, round, and reactive to light.  Neck:     Musculoskeletal: Normal range of motion.     Thyroid: No thyromegaly.  Trachea: No tracheal deviation.  Cardiovascular:     Rate and Rhythm: Normal rate and regular rhythm.     Heart sounds: Normal heart sounds. No murmur. No friction rub. No gallop.   Pulmonary:     Breath sounds: Normal breath sounds. No wheezing or rales.  Abdominal:     General: Bowel sounds are normal. There is no distension.     Palpations: Abdomen is soft. There is no mass.     Tenderness: There is no abdominal  tenderness.     Hernia: There is no hernia in the right inguinal area or left inguinal area.  Genitourinary:    Penis: Normal.      Scrotum/Testes: Normal.  Musculoskeletal: Normal range of motion.  Lymphadenopathy:     Cervical: No cervical adenopathy.  Skin:    General: Skin is warm and dry.  Neurological:     Mental Status: He is alert and oriented to person, place, and time.       Assessment & Plan:   [name] was seen today for annual exam.  Diagnoses and all orders for this visit:  Well adult exam -     Urinalysis -     CBC with Differential/Platelet -     CMP14+EGFR -     Lipid panel -     PSA, total and free -     VITAMIN D 25 Hydroxy (Vit-D Deficiency, Fractures)  Alpha galactosidase deficiency  Idiopathic peripheral neuropathy -     Oxycodone HCl 10 MG TABS; Take 1 tablet (10 mg total) by mouth every 4 (four) hours as needed (severe pain of abdominal neuropathy).  Other orders -     pregabalin (LYRICA) 50 MG capsule; 1 qhs X7 days , then 2 qhs X 7d, then 3 qhs X 7d, then 4 qhs -     ergocalciferol (VITAMIN D2) 1.25 MG (50000 UT) capsule; Take 1 capsule (50,000 Units total) by mouth once a week.       I have changed Wyeth Zerkle's ergocalciferol. I am also having him start on pregabalin. Additionally, I am having him maintain his Oxycodone HCl.  Allergies as of 11/13/2018   No Known Allergies     Medication List       Accurate as of Nov 13, 2018  5:44 PM. If you have any questions, ask your nurse or doctor.        ergocalciferol 1.25 MG (50000 UT) capsule Commonly known as:  VITAMIN D2 Take 1 capsule (50,000 Units total) by mouth once a week. Started by:  Claretta Fraise, MD   Oxycodone HCl 10 MG Tabs Take 1 tablet (10 mg total) by mouth every 4 (four) hours as needed (severe pain of abdominal neuropathy).   pregabalin 50 MG capsule Commonly known as:  Lyrica 1 qhs X7 days , then 2 qhs X 7d, then 3 qhs X 7d, then 4 qhs Started by:  Claretta Fraise, MD        Follow-up: Return in about 1 year (around 11/13/2019).  Claretta Fraise, M.D.

## 2018-11-14 LAB — CMP14+EGFR
ALT: 19 IU/L (ref 0–44)
AST: 13 IU/L (ref 0–40)
Albumin/Globulin Ratio: 2.4 — ABNORMAL HIGH (ref 1.2–2.2)
Albumin: 4.5 g/dL (ref 4.0–5.0)
Alkaline Phosphatase: 90 IU/L (ref 39–117)
BUN/Creatinine Ratio: 10 (ref 9–20)
BUN: 10 mg/dL (ref 6–24)
Bilirubin Total: 0.2 mg/dL (ref 0.0–1.2)
CO2: 24 mmol/L (ref 20–29)
Calcium: 9.4 mg/dL (ref 8.7–10.2)
Chloride: 107 mmol/L — ABNORMAL HIGH (ref 96–106)
Creatinine, Ser: 0.99 mg/dL (ref 0.76–1.27)
GFR calc Af Amer: 103 mL/min/{1.73_m2} (ref >59)
GFR calc non Af Amer: 89 mL/min/{1.73_m2} (ref >59)
Globulin, Total: 1.9 g/dL (ref 1.5–4.5)
Glucose: 91 mg/dL (ref 65–99)
Potassium: 4.3 mmol/L (ref 3.5–5.2)
Sodium: 144 mmol/L (ref 134–144)
Total Protein: 6.4 g/dL (ref 6.0–8.5)

## 2018-11-14 LAB — VITAMIN D 25 HYDROXY (VIT D DEFICIENCY, FRACTURES): Vit D, 25-Hydroxy: 30.1 ng/mL (ref 30.0–100.0)

## 2018-11-14 LAB — CBC WITH DIFFERENTIAL/PLATELET
Basophils Absolute: 0 10*3/uL (ref 0.0–0.2)
Basos: 1 %
EOS (ABSOLUTE): 0.2 10*3/uL (ref 0.0–0.4)
Eos: 2 %
Hematocrit: 41.9 % (ref 37.5–51.0)
Hemoglobin: 14.7 g/dL (ref 13.0–17.7)
Immature Grans (Abs): 0 10*3/uL (ref 0.0–0.1)
Immature Granulocytes: 0 %
Lymphocytes Absolute: 2.2 10*3/uL (ref 0.7–3.1)
Lymphs: 25 %
MCH: 32.3 pg (ref 26.6–33.0)
MCHC: 35.1 g/dL (ref 31.5–35.7)
MCV: 92 fL (ref 79–97)
Monocytes Absolute: 0.8 10*3/uL (ref 0.1–0.9)
Monocytes: 9 %
Neutrophils Absolute: 5.5 10*3/uL (ref 1.4–7.0)
Neutrophils: 63 %
Platelets: 270 10*3/uL (ref 150–450)
RBC: 4.55 x10E6/uL (ref 4.14–5.80)
RDW: 12.7 % (ref 11.6–15.4)
WBC: 8.8 10*3/uL (ref 3.4–10.8)

## 2018-11-14 LAB — LIPID PANEL
Chol/HDL Ratio: 8.4 ratio — ABNORMAL HIGH (ref 0.0–5.0)
Cholesterol, Total: 201 mg/dL — ABNORMAL HIGH (ref 100–199)
HDL: 24 mg/dL — ABNORMAL LOW (ref >39)
LDL Calculated: 123 mg/dL — ABNORMAL HIGH (ref 0–99)
Triglycerides: 268 mg/dL — ABNORMAL HIGH (ref 0–149)
VLDL Cholesterol Cal: 54 mg/dL — ABNORMAL HIGH (ref 5–40)

## 2018-11-14 LAB — PSA, TOTAL AND FREE
PSA, Free Pct: 24.1 %
PSA, Free: 0.41 ng/mL
Prostate Specific Ag, Serum: 1.7 ng/mL (ref 0.0–4.0)

## 2018-11-15 ENCOUNTER — Other Ambulatory Visit: Payer: Self-pay | Admitting: Family Medicine

## 2018-11-15 MED ORDER — FENOFIBRATE 160 MG PO TABS: 160.0000 mg | ORAL_TABLET | Freq: Every day | ORAL | 3 refills | Status: DC

## 2018-11-16 MED FILL — FENOFIBRATE 160 MG TABLET: 160 | 90 days supply | Qty: 90 | Fill #0

## 2018-12-20 DIAGNOSIS — Z0100 Encounter for examination of eyes and vision without abnormal findings: Secondary | ICD-10-CM | POA: Diagnosis not present

## 2020-05-09 DIAGNOSIS — Z0100 Encounter for examination of eyes and vision without abnormal findings: Secondary | ICD-10-CM | POA: Diagnosis not present

## 2020-10-05 ENCOUNTER — Encounter: Payer: Self-pay | Admitting: Family Medicine

## 2020-10-05 ENCOUNTER — Ambulatory Visit: Payer: PRIVATE HEALTH INSURANCE | Admitting: Family Medicine

## 2020-10-05 ENCOUNTER — Other Ambulatory Visit: Payer: Self-pay | Admitting: Family Medicine

## 2020-10-05 ENCOUNTER — Other Ambulatory Visit: Payer: Self-pay

## 2020-10-05 VITALS — BP 147/91 | HR 75 | Temp 97.8°F | Ht 73.0 in | Wt 194.2 lb

## 2020-10-05 DIAGNOSIS — K5904 Chronic idiopathic constipation: Secondary | ICD-10-CM

## 2020-10-05 DIAGNOSIS — F4310 Post-traumatic stress disorder, unspecified: Secondary | ICD-10-CM

## 2020-10-05 DIAGNOSIS — G609 Hereditary and idiopathic neuropathy, unspecified: Secondary | ICD-10-CM

## 2020-10-05 MED ORDER — DULOXETINE HCL 30 MG PO CPEP: 30.0000 mg | ORAL_CAPSULE | Freq: Every day | ORAL | 0 refills | Status: DC

## 2020-10-05 MED ORDER — TRAMADOL HCL 50 MG PO TABS: 50.0000 mg | ORAL_TABLET | Freq: Four times a day (QID) | ORAL | 2 refills | Status: DC | PRN

## 2020-10-05 MED ORDER — MAGIC MOUTHWASH: 5.0000 mL | Freq: Four times a day (QID) | ORAL | 2 refills | Status: DC | PRN

## 2020-10-05 NOTE — Telephone Encounter (Signed)
Pharmacy is aware of diagnosis code.
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
## 2020-10-05 NOTE — Patient Instructions (Signed)
[facility]  Metamucil one tablespoon twice a day.  Miralax one capful twice a day as needed.

## 2020-10-06 ENCOUNTER — Telehealth: Payer: Self-pay | Admitting: *Deleted

## 2020-10-06 NOTE — Telephone Encounter (Signed)
Please advise Magic Mouthwash components

## 2020-10-07 ENCOUNTER — Other Ambulatory Visit: Payer: Self-pay | Admitting: Family Medicine

## 2020-10-07 MED ORDER — MAGIC MOUTHWASH: 5.0000 mL | Freq: Four times a day (QID) | ORAL | 2 refills | Status: DC | PRN

## 2020-10-07 NOTE — Telephone Encounter (Signed)
Please let the patient know that I sent their prescription to their pharmacy. Thanks, WS 

## 2020-11-03 ENCOUNTER — Ambulatory Visit: Payer: PRIVATE HEALTH INSURANCE | Admitting: Family Medicine

## 2020-11-09 ENCOUNTER — Ambulatory Visit: Payer: PRIVATE HEALTH INSURANCE | Admitting: Family Medicine

## 2020-11-09 ENCOUNTER — Other Ambulatory Visit: Payer: Self-pay

## 2020-11-09 ENCOUNTER — Encounter: Payer: Self-pay | Admitting: Family Medicine

## 2020-11-09 VITALS — BP 162/93 | HR 80 | Temp 98.1°F | Ht 73.0 in | Wt 192.0 lb

## 2020-11-09 DIAGNOSIS — F4310 Post-traumatic stress disorder, unspecified: Secondary | ICD-10-CM

## 2020-11-09 MED ORDER — DULOXETINE HCL 60 MG PO CPEP: 120.0000 mg | ORAL_CAPSULE | Freq: Every day | ORAL | 5 refills | Status: DC

## 2020-11-09 MED ORDER — QUETIAPINE FUMARATE 25 MG PO TABS: 25.0000 mg | ORAL_TABLET | Freq: Every day | ORAL | 1 refills | Status: DC
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
## 2020-11-09 NOTE — Patient Instructions (Signed)

## 2020-11-10 ENCOUNTER — Ambulatory Visit: Payer: PRIVATE HEALTH INSURANCE | Admitting: Family Medicine

## 2020-12-14 ENCOUNTER — Ambulatory Visit: Payer: PRIVATE HEALTH INSURANCE | Admitting: Family Medicine

## 2023-11-26 ENCOUNTER — Ambulatory Visit: Payer: Self-pay

## 2023-11-26 NOTE — Telephone Encounter (Signed)
 Copied From CRM [id number]. Reason for Triage: Patient states he was see in the urgent care  on Friday and ER on Saturday due to his blood pressure being in the 235/135. This morning it was 173/113. He was prescribed losartan and wants to know should he continue with the medication. Patient would like a call back at [phone]   Chief Complaint: high blood pressure Symptoms: 173/113 Frequency: x 3 days Pertinent Negatives: Patient denies blurred vision, headache Disposition: [] ED /[] Urgent Care (no appt availability in office) / [x] Appointment(In office/virtual)/ []  [facility]/ [] Home Care/ [x] Refused Recommended Disposition /[] [facility]/ []  Follow-up with PCP Additional Notes: pt stating BP getting better after ED visit. pt stated he has not been to office in a long time due to not having any issues: would like Dr. Veleta Gerold to provide care for him as they agreed. Pt was informed of having to do a new pt visit and not available until 01/2024: pt stated he wants information sent to PCP to schedule an appt and call pt back because pt has to be seen by PCP before returning to work.  New pt appt offered and denied by pt.   Answer Assessment - Initial Assessment Questions 1. BLOOD PRESSURE: "What is the blood pressure?" "Did you take at least two measurements 5 minutes apart?"     173/113, 170/112 2. ONSET: "When did you take your blood pressure?"     today 3. HOW: "How did you take your blood pressure?" (e.g., automatic home BP monitor, visiting nurse)     automatic 4. HSTORY: "Do you have a history of high blood pressure?"     yes 5. MEDICINES: "Are you taking any medicines for blood pressure?" "Have you missed any doses recently?"     no 6. OTHER SYMPTOMS: "Do you have any symptoms?" (e.g., blurred vision, chest pain, difficulty breathing, headache, weakness)     no 7. PREGNANCY: "Is there any chance you are pregnant?" "When was your last menstrual period?"      N/a  Protocols used: Blood Pressure - High-A-AH

## 2023-11-26 NOTE — Telephone Encounter (Signed)
 Spoke to pt and he has been scheduled to establish care again and discuss BP.

## 2023-11-27 ENCOUNTER — Encounter: Payer: Self-pay | Admitting: Family Medicine

## 2023-11-27 ENCOUNTER — Ambulatory Visit: Admitting: Family Medicine

## 2023-11-27 VITALS — BP 146/87 | HR 65 | Temp 98.2°F | Ht 73.0 in | Wt 192.0 lb

## 2023-11-27 DIAGNOSIS — F321 Major depressive disorder, single episode, moderate: Secondary | ICD-10-CM

## 2023-11-27 DIAGNOSIS — Z1211 Encounter for screening for malignant neoplasm of colon: Secondary | ICD-10-CM | POA: Diagnosis not present

## 2023-11-27 DIAGNOSIS — Z23 Encounter for immunization: Secondary | ICD-10-CM

## 2023-11-27 DIAGNOSIS — I10 Essential (primary) hypertension: Secondary | ICD-10-CM

## 2023-11-27 LAB — LIPID PANEL

## 2023-11-27 MED ORDER — QUETIAPINE FUMARATE 25 MG PO TABS: 25.0000 mg | ORAL_TABLET | Freq: Every day | ORAL | 1 refills | Status: DC

## 2023-11-27 MED ORDER — OLMESARTAN MEDOXOMIL 20 MG PO TABS: 20.0000 mg | ORAL_TABLET | Freq: Every day | ORAL | 1 refills | Status: DC

## 2023-11-27 MED ORDER — BUPROPION HCL ER (SR) 150 MG PO TB12: 150.0000 mg | ORAL_TABLET | Freq: Two times a day (BID) | ORAL | 2 refills | Status: DC

## 2023-11-27 NOTE — Patient Instructions (Signed)
Your provider wants you to schedule an appointment with a Psychologist/Psychiatrist. The following list of offices requires the patient to call and make their own appointment, as there is information they need that only you can provide. Please feel free to choose form the following providers:  [facility]   [phone] Crisis Recovery in [place] [phone]  [facility]  [phone]   [address] [place], [place]  (Scheduled through [facility]) Must call and do an interview for appointment. Sees Children / Accepts Medicaid  [facility]    [phone]  [address], [address]    [place], [place]       [facility]  [phone] [address] [place], [place]  Evaluates for Autism but does not treat it Sees Children / Accepts Medicaid  [facility]    [phone] [address], [address]   [place], [place] Medication management, substance abuse, bipolar, grief, family, marriage, OCD, anxiety, PTSD Sees children / Accepts Medicaid  [facility]    [phone] [address] [place], [place] Sees children / Accepts Medicaid  [facility]  [phone] [address] [place], [place]   Dr Akinlayo     [phone] [address] [place], [place]  Sees ADD & ADHD for treatment Accepts Medicaid  [facility]  [phone] [address] [place], [place] Evaluates for Autism Accepts Medicaid  [facility]  [phone] [address] [place], [place]  Does Adult ADD evaluations Does not accept Medicaid  [facility]   [phone] [address]   , [place] Uses animal therapy  Sees children as young as 3 years old Accepts Medicaid  [facility]     [phone]    [address]  [place],  [address] Sees children Accepts Medicaid  

## 2023-11-27 NOTE — Progress Notes (Signed)
 Subjective:  Patient ID: Maurice Anderson, male    DOB: 1971/01/20  Age: 53 y.o. MRN: [id number]  CC: Establish Care (Did not have insurance. ) and Hypertension ([place] Friday pt went to UC BP was 197/130 and got up to 200 something over 140. Sweaty, light headed and felt bed. Sunday it spiked again 197/132 pt was seen again then he was given losartan 50 mg.  Bp has been around 170/110- 134/80 since starting med.  High in the mornings before taking medication.  //* Pt was told not work until on the medication for a little and BP normalizes. Needs to be cleared by us . ) +  HPI Maurice Anderson presents for new patient evaluation. Now working in [place]. 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 Off all meds except recently started on losartan. Takes Aleve for HA.  He relates to me that he works 12-hour days and drives an hour and a half each way to [place]  from his home.  He lives at home he does not want to move.  He has a view [place] and [place] from his backyard and reviews of the [place]  mountains from his front yard.  He has children ages 24 and 66 and does not want them.  As a result he is considering changing jobs to be closer to home.  He is only getting about 3 to 4 hours sleep a night.  In addition to his work he has been trying to spend time teaching his 59 year old daughter to golf and working with his 3 year old son and other areas.  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  Two weeks ago awoke feeling bad. Went back to bed for 6 hours. Tired, dizzy, achy. 4 days ago felt lightheaded and faint. At work BP was 197/113. EMS took him to [facility]  and bp reduced to 165/113. Next day HA like head will explode. Started on losartan. Took aleve, excedrin migraine & aleve.     11/27/2023   10:14 AM 11/09/2020     1:56 PM 10/05/2020    4:40 PM  Depression screen PHQ 2/9  Decreased Interest 2 3 0  Down, Depressed, Hopeless 1 2 1   PHQ - 2 Score 3 5 1   Altered sleeping 3 3 3   Tired, decreased energy 3 3 1   Change in appetite 2 3 1   Feeling bad or failure about yourself  2 3 2   Trouble concentrating 1 2 0  Moving slowly or fidgety/restless 1 2 0  Suicidal thoughts 0 0 0  PHQ-9 Score 15 21 8   Difficult doing work/chores Somewhat difficult Extremely dIfficult Not difficult at all      11/27/2023   10:14 AM 11/09/2020    1:58 PM 10/05/2020    4:41 PM  GAD 7 : Generalized Anxiety Score  Nervous, Anxious, on Edge 3 3 2   Control/stop worrying 3 3 3   Worry too much - different things 3 3 3   Trouble relaxing 3 3 2   Restless 2 3 2   Easily annoyed or irritable 1 2 0  Afraid - awful might happen 2 3 2   Total GAD 7 Score 17 20 14   Anxiety Difficulty Somewhat difficult Very difficult Somewhat difficult     History Maurice Anderson has a past medical history of Allergy, Arthritis, and Neuromuscular disorder (HCC).   He has  a past surgical history that includes Knee arthroscopy (Right, 1991) and Vasectomy.   His family history includes Alcohol abuse in his father; Arthritis in his paternal grandmother; Depression in his mother; Diabetes in his father and mother; Drug abuse in his mother; Heart disease in his maternal grandfather; Hyperlipidemia in his maternal grandmother and mother; Stroke in his maternal grandfather.He reports that he has been smoking. He has never used smokeless tobacco. He reports that he does not drink alcohol and does not use drugs.    ROS Review of Systems  Constitutional: Negative.   HENT: Negative.    Eyes:  Negative for visual disturbance.  Respiratory:  Negative for cough and shortness of breath.   Cardiovascular:  Negative for chest pain and leg swelling.  Gastrointestinal:  Negative for abdominal pain, diarrhea, nausea and vomiting.  Genitourinary:  Negative for difficulty  urinating.  Musculoskeletal:  Negative for arthralgias and myalgias.  Skin:  Negative for rash.  Neurological:  Positive for headaches.  Psychiatric/Behavioral:  Positive for sleep disturbance.   Thank you  Objective:  BP (!) 146/87   Pulse 65   Temp 98.2 F (36.8 C)   Ht 6\' 1"  (1.854 m)   Wt 192 lb (87.1 kg)   SpO2 98%   BMI 25.33 kg/m   BP Readings from Last 3 Encounters:  11/27/23 (!) 146/87  11/09/20 (!) 162/93  10/05/20 (!) 147/91    Wt Readings from Last 3 Encounters:  11/27/23 192 lb (87.1 kg)  11/09/20 192 lb (87.1 kg)  10/05/20 194 lb 3.2 oz (88.1 kg)     Physical Exam Constitutional:      General: He is not in acute distress.    Appearance: Normal appearance. He is well-developed. He is not ill-appearing.     Comments: Patient appears to be in excellent physical condition.  Muscle mass is well toned.  He is slender.  HENT:     Head: Normocephalic and atraumatic.  Eyes:     Pupils: Pupils are equal, round, and reactive to light.  Neck:     Thyroid: No thyromegaly.     Trachea: No tracheal deviation.  Cardiovascular:     Rate and Rhythm: Normal rate and regular rhythm.     Heart sounds: Normal heart sounds. No murmur heard.    No friction rub. No gallop.  Pulmonary:     Breath sounds: Normal breath sounds. No wheezing or rales.  Abdominal:     General: Bowel sounds are normal. There is no distension.     Palpations: Abdomen is soft. There is no mass.     Tenderness: There is no abdominal tenderness.     Hernia: There is no hernia in the left inguinal area.  Genitourinary:    Penis: Normal.      Testes: Normal.        Right: Mass, testicular hydrocele or varicocele not present. Cremasteric reflex is present.         Left: Mass, testicular hydrocele or varicocele not present. Cremasteric reflex is present.      Comments: He carries the right testicle somewhat high.  There is some scar tissue likely related to a spermatocele which would be the result of  previous vasectomy Musculoskeletal:        General: Normal range of motion.     Cervical back: Normal range of motion.  Lymphadenopathy:     Cervical: No cervical adenopathy.  Skin:    General: Skin is warm and dry.  Neurological:  Mental Status: He is alert and oriented to person, place, and time.      Assessment & Plan:  Primary hypertension -     CBC with Differential/Platelet -     CMP14+EGFR -     Lipid panel -     PSA, total and free  Immunization due -     Tdap vaccine greater than or equal to 7yo IM  Screen for colon cancer -     Ambulatory referral to Gastroenterology  Depression, major, single episode, moderate (HCC)  Other orders -     Olmesartan Medoxomil; Take 1 tablet (20 mg total) by mouth daily. For blood pressure  Dispense: 30 tablet; Refill: 1 -     buPROPion HCl ER (SR); Take 1 tablet (150 mg total) by mouth 2 (two) times daily. For depression  Dispense: 60 tablet; Refill: 2 -     QUEtiapine  Fumarate; Take 1 tablet (25 mg total) by mouth at bedtime.  Dispense: 30 tablet; Refill: 1   65 minutes were spent with the patient more than one half was spent in discussion of his medical condition and care plan related.  He was encouraged to seek counseling and psychologist recommendation list was printed.  We had a frank discussion about his work schedule and sleep habits as well.  Neither of which seem to be conducive to controlling his blood pressure or promoting good health.  Follow-up: Return in about 3 weeks (around 12/18/2023).  Roise Cleaver, M.D.

## 2023-11-28 ENCOUNTER — Other Ambulatory Visit: Payer: Self-pay | Admitting: Family Medicine

## 2023-11-28 ENCOUNTER — Ambulatory Visit: Payer: Self-pay | Admitting: Family Medicine

## 2023-11-28 LAB — LIPID PANEL
Cholesterol, Total: 234 mg/dL — ABNORMAL HIGH (ref 100–199)
HDL: 29 mg/dL — ABNORMAL LOW (ref >39)
LDL CALC COMMENT:: 8.1 ratio — ABNORMAL HIGH (ref 0.0–5.0)
LDL Chol Calc (NIH): 141 mg/dL — ABNORMAL HIGH (ref 0–99)
Triglycerides: 348 mg/dL — ABNORMAL HIGH (ref 0–149)
VLDL Cholesterol Cal: 64 mg/dL — ABNORMAL HIGH (ref 5–40)

## 2023-11-28 LAB — CBC WITH DIFFERENTIAL/PLATELET
Basophils Absolute: 0.1 10*3/uL (ref 0.0–0.2)
Basos: 1 %
EOS (ABSOLUTE): 0.2 10*3/uL (ref 0.0–0.4)
Eos: 2 %
Hematocrit: 45.5 % (ref 37.5–51.0)
Hemoglobin: 15.3 g/dL (ref 13.0–17.7)
Immature Grans (Abs): 0 10*3/uL (ref 0.0–0.1)
Immature Granulocytes: 0 %
Lymphocytes Absolute: 2.1 10*3/uL (ref 0.7–3.1)
Lymphs: 19 %
MCH: 32.3 pg (ref 26.6–33.0)
MCHC: 33.6 g/dL (ref 31.5–35.7)
MCV: 96 fL (ref 79–97)
Monocytes Absolute: 1.1 10*3/uL — ABNORMAL HIGH (ref 0.1–0.9)
Monocytes: 10 %
Neutrophils Absolute: 7.8 10*3/uL — ABNORMAL HIGH (ref 1.4–7.0)
Neutrophils: 68 %
Platelets: 279 10*3/uL (ref 150–450)
RBC: 4.74 x10E6/uL (ref 4.14–5.80)
RDW: 12.7 % (ref 11.6–15.4)
WBC: 11.3 10*3/uL — ABNORMAL HIGH (ref 3.4–10.8)

## 2023-11-28 LAB — CMP14+EGFR
ALT: 22 IU/L (ref 0–44)
AST: 23 IU/L (ref 0–40)
Albumin: 4.6 g/dL (ref 3.8–4.9)
Alkaline Phosphatase: 99 IU/L (ref 44–121)
BUN/Creatinine Ratio: 14 (ref 9–20)
BUN: 16 mg/dL (ref 6–24)
Bilirubin Total: 0.3 mg/dL (ref 0.0–1.2)
CO2: 19 mmol/L — ABNORMAL LOW (ref 20–29)
Calcium: 10.1 mg/dL (ref 8.7–10.2)
Chloride: 104 mmol/L (ref 96–106)
Creatinine, Ser: 1.11 mg/dL (ref 0.76–1.27)
Globulin, Total: 2.1 g/dL (ref 1.5–4.5)
Glucose: 91 mg/dL (ref 70–99)
Potassium: 5.2 mmol/L (ref 3.5–5.2)
Sodium: 140 mmol/L (ref 134–144)
Total Protein: 6.7 g/dL (ref 6.0–8.5)
eGFR: 79 mL/min/{1.73_m2} (ref >59)

## 2023-11-28 LAB — PSA, TOTAL AND FREE
PSA, Free Pct: 32.8 %
PSA, Free: 0.59 ng/mL
Prostate Specific Ag, Serum: 1.8 ng/mL (ref 0.0–4.0)

## 2023-11-28 MED ORDER — ROSUVASTATIN CALCIUM 10 MG PO TABS: 10.0000 mg | ORAL_TABLET | Freq: Every day | ORAL | 1 refills | Status: AC

## 2023-12-18 ENCOUNTER — Encounter: Payer: Self-pay | Admitting: Family Medicine

## 2023-12-18 ENCOUNTER — Ambulatory Visit: Admitting: Family Medicine

## 2023-12-18 VITALS — BP 138/83 | Temp 97.9°F | Ht 73.0 in | Wt 195.0 lb

## 2023-12-18 DIAGNOSIS — I10 Essential (primary) hypertension: Secondary | ICD-10-CM | POA: Insufficient documentation

## 2023-12-18 DIAGNOSIS — E782 Mixed hyperlipidemia: Secondary | ICD-10-CM | POA: Diagnosis not present

## 2023-12-18 DIAGNOSIS — F321 Major depressive disorder, single episode, moderate: Secondary | ICD-10-CM | POA: Diagnosis not present

## 2023-12-18 MED ORDER — BUPROPION HCL ER (XL) 300 MG PO TB24: 300.0000 mg | ORAL_TABLET | Freq: Every day | ORAL | 0 refills | Status: DC

## 2023-12-18 MED ORDER — OLMESARTAN MEDOXOMIL 40 MG PO TABS: 40.0000 mg | ORAL_TABLET | Freq: Every day | ORAL | 1 refills | Status: AC

## 2023-12-18 NOTE — Progress Notes (Signed)
 Subjective:  Patient ID: Maurice Anderson, male    DOB: 28-May-1971  Age: 53 y.o. MRN: [id number]  CC: Hypertension (Pt has noticed that he has been more iratible since starting the medications.  Bp still running high as well. 150-170/ 100. Heartburn since starting medications as well. )   HPI Maurice Anderson presents for recheck of BP, depression Decreased Montain Dew to one can instead of 6. Drinking 8 water bottles a day. More irritable. Not worried as much. Less stressed. Was off work 2 weeks.  Now working on trying to get at least 6 weeks of sleep per night.  When he took the quetiapine  at bedtime he was too groggy when he woke up.  However he started going to bed earlier he gets up at 2:30 in the morning so he is going to bed at about 7-7 30.  Taking the medication at that time.  Things are going better work because his boss's boss moved his boss to a different building to work and Maurice Anderson is now working more directly with his boss's boss.  His direct boss had been creating more stress for Maurice Anderson.  Meanwhile he says that the irritability was worse during the first week of taking the medications but does continue.  The heartburn seem to occur shortly after taking the Wellbutrin /bupropion .  His blood pressure is not well-controlled he notes.  However, he is on the lower dose of the Benicar  which seems to agree with him okay.  He is tolerating the rosuvastatin  very well also.     12/18/2023    4:00 PM 11/27/2023   10:14 AM 11/09/2020    1:56 PM  Depression screen PHQ 2/9  Decreased Interest 1 2 3   Down, Depressed, Hopeless 1 1 2   PHQ - 2 Score 2 3 5   Altered sleeping 1 3 3   Tired, decreased energy 1 3 3   Change in appetite 0 2 3  Feeling bad or failure about yourself  1 2 3   Trouble concentrating 2 1 2   Moving slowly or fidgety/restless 2 1 2   Suicidal thoughts 0 0 0  PHQ-9 Score 9 15 21   Difficult doing work/chores Somewhat difficult Somewhat difficult Extremely dIfficult     History Maurice Anderson has a past medical history of Allergy, Arthritis, and Neuromuscular disorder (HCC).   He has a past surgical history that includes Knee arthroscopy (Right, 1991) and Vasectomy.   His family history includes Alcohol abuse in his father; Arthritis in his paternal grandmother; Depression in his mother; Diabetes in his father and mother; Drug abuse in his mother; Heart disease in his maternal grandfather; Hyperlipidemia in his maternal grandmother and mother; Stroke in his maternal grandfather.He reports that he has been smoking. He has never used smokeless tobacco. He reports that he does not drink alcohol and does not use drugs.    ROS Review of Systems  Constitutional:  Negative for fever.  Respiratory:  Negative for shortness of breath.   Cardiovascular:  Negative for chest pain.  Musculoskeletal:  Negative for arthralgias.  Skin:  Negative for rash.  Psychiatric/Behavioral:  Positive for agitation (Intermittently has a short fuse.). Negative for decreased concentration, dysphoric mood and sleep disturbance. The patient is not nervous/anxious.     Objective:  BP 138/83   Temp 97.9 F (36.6 C)   Ht 6' 1 (1.854 m)   Wt 195 lb (88.5 kg)   SpO2 98%   BMI 25.73 kg/m   BP Readings from Last 3 Encounters:  12/18/23  138/83  11/27/23 (!) 146/87  11/09/20 (!) 162/93    Wt Readings from Last 3 Encounters:  12/18/23 195 lb (88.5 kg)  11/27/23 192 lb (87.1 kg)  11/09/20 192 lb (87.1 kg)     Physical Exam Vitals reviewed.  Constitutional:      Appearance: He is well-developed.  HENT:     Head: Normocephalic and atraumatic.     Right Ear: External ear normal.     Left Ear: External ear normal.     Mouth/Throat:     Pharynx: No oropharyngeal exudate or posterior oropharyngeal erythema.   Eyes:     Pupils: Pupils are equal, round, and reactive to light.    Cardiovascular:     Rate and Rhythm: Normal rate and regular rhythm.     Heart sounds: No murmur  heard. Pulmonary:     Effort: No respiratory distress.     Breath sounds: Normal breath sounds.   Musculoskeletal:     Cervical back: Normal range of motion and neck supple.   Neurological:     Mental Status: He is alert and oriented to person, place, and time.      Assessment & Plan:  Primary hypertension  Depression, major, single episode, moderate (HCC)  Mixed hyperlipidemia  Other orders -     buPROPion  HCl ER (XL); Take 1 tablet (300 mg total) by mouth daily.  Dispense: 30 tablet; Refill: 0 -     Olmesartan  Medoxomil; Take 1 tablet (40 mg total) by mouth daily. For blood pressure  Dispense: 90 tablet; Refill: 1  Do the heartburn I want a make a switch over to the bupropion  XL to see if that will cause a little bit less discomfort with regard to heartburn.  I am not going to increase the dose for couple reasons.  #1 is that the higher dose is not covered by his insurance although it might be the best for him.  However, I would like to give the medication a little bit more time to see if it will resolve the irritability and the heartburn as it gets into his system better and he gets used to the medication.   Follow-up: Return in about 1 month (around 01/17/2024).  Butler Der, M.D.

## 2024-01-14 ENCOUNTER — Ambulatory Visit: Admitting: Family Medicine

## 2024-01-14 ENCOUNTER — Encounter: Payer: Self-pay | Admitting: Family Medicine

## 2024-01-14 ENCOUNTER — Other Ambulatory Visit (HOSPITAL_COMMUNITY): Payer: Self-pay

## 2024-01-14 VITALS — BP 131/83 | HR 83 | Temp 98.0°F | Ht 73.0 in | Wt 191.0 lb

## 2024-01-14 DIAGNOSIS — I10 Essential (primary) hypertension: Secondary | ICD-10-CM

## 2024-01-14 DIAGNOSIS — F321 Major depressive disorder, single episode, moderate: Secondary | ICD-10-CM

## 2024-01-14 MED ORDER — BUPROPION HCL ER (XL) 300 MG PO TB24
300.0000 mg | ORAL_TABLET | Freq: Every day | ORAL | 0 refills | Status: AC
  Filled 2024-01-14 – 2024-01-18 (×2): qty 30, 30d supply, fill #0

## 2024-01-14 MED ORDER — QUETIAPINE FUMARATE 25 MG PO TABS
25.0000 mg | ORAL_TABLET | Freq: Every day | ORAL | 1 refills | Status: AC
  Filled 2024-01-14 – 2024-01-18 (×2): qty 30, 30d supply, fill #0

## 2024-01-14 NOTE — Progress Notes (Signed)
 Subjective:  Patient ID: Maurice Anderson, male    DOB: [birth year]  Age: 53 y.o. MRN: [id number]  CC: Medical Management of Chronic Issues   HPI Maurice Anderson presents for depression is much more bearable.  He is no longer getting angry.  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  He has not had any more episodes like breaking the chair that he mention 2 visits ago.  He notes that he is sleeping well but the quetiapine  does make him a bit groggy the following morning.  Unfortunately he is still having to get up at 2:30 in the morning.  He works 16-hour days including the drive to and from work.     01/14/2024    4:16 PM 12/18/2023    4:00 PM 11/27/2023   10:14 AM  Depression screen PHQ 2/9  Decreased Interest 1 1 2   Down, Depressed, Hopeless 1 1 1   PHQ - 2 Score 2 2 3   Altered sleeping 1 1 3   Tired, decreased energy 1 1 3   Change in appetite 1 0 2  Feeling bad or failure about yourself  1 1 2   Trouble concentrating 1 2 1   Moving slowly or fidgety/restless 1 2 1   Suicidal thoughts 0 0 0  PHQ-9 Score 8 9 15   Difficult doing work/chores Somewhat difficult Somewhat difficult Somewhat difficult    History Maurice Anderson has a past medical history of Allergy, Arthritis, and Neuromuscular disorder (HCC).   He has a past surgical history that includes Knee arthroscopy (Right, 1991) and Vasectomy.   His family history includes Alcohol abuse in his father; Arthritis in his paternal grandmother; Depression in his mother; Diabetes in his father and mother; Drug abuse in his mother; Heart disease in his maternal grandfather; Hyperlipidemia in his maternal grandmother and mother; Stroke in his maternal grandfather.He reports that he has been smoking. He has never used smokeless tobacco. He reports that he does not drink alcohol and does not use drugs.    ROS Review of Systems  Constitutional:  Negative for fever.  Respiratory:  Negative for shortness  of breath.   Cardiovascular:  Negative for chest pain.  Musculoskeletal:  Negative for arthralgias.  Skin:  Negative for rash.  Psychiatric/Behavioral:  Positive for sleep disturbance (too tired when using seroquel  especially toward the end of the week.). Negative for agitation and decreased concentration. The patient is not nervous/anxious and is not hyperactive.     Objective:  BP 131/83   Pulse 83   Temp 98 F (36.7 C)   Ht 6' 1 (1.854 m)   Wt 191 lb (86.6 kg)   SpO2 97%   BMI 25.20 kg/m   BP Readings from Last 3 Encounters:  01/14/24 131/83  12/18/23 138/83  11/27/23 (!) 146/87    Wt Readings from Last 3 Encounters:  01/14/24 191 lb (86.6 kg)  12/18/23 195 lb (88.5 kg)  11/27/23 192 lb (87.1 kg)     Physical Exam Vitals reviewed.  Constitutional:      Appearance: He is well-developed.  HENT:     Head: Normocephalic and atraumatic.     Right Ear: External ear normal.     Left Ear: External ear normal.     Mouth/Throat:     Pharynx: No oropharyngeal exudate or posterior oropharyngeal erythema.  Eyes:     Pupils: Pupils are equal, round, and reactive to light.  Cardiovascular:     Rate and Rhythm:  Normal rate and regular rhythm.     Heart sounds: No murmur heard. Pulmonary:     Effort: No respiratory distress.     Breath sounds: Normal breath sounds.  Musculoskeletal:     Cervical back: Normal range of motion and neck supple.  Neurological:     Mental Status: He is alert and oriented to person, place, and time.      Assessment & Plan:  Depression, major, single episode, moderate (HCC)  Primary hypertension  Other orders -     QUEtiapine  Fumarate; Take 1 tablet (25 mg total) by mouth at bedtime.  Dispense: 30 tablet; Refill: 1 -     buPROPion  HCl ER (XL); Take 1 tablet (300 mg total) by mouth daily.  Dispense: 30 tablet; Refill: 0     Follow-up: Return in about 3 months (around 04/15/2024) for Depression.  Butler Der, M.D.

## 2024-01-18 ENCOUNTER — Other Ambulatory Visit (HOSPITAL_COMMUNITY): Payer: Self-pay

## 2024-01-18 ENCOUNTER — Encounter (HOSPITAL_COMMUNITY): Payer: Self-pay

## 2024-01-21 ENCOUNTER — Encounter (HOSPITAL_COMMUNITY): Payer: Self-pay | Admitting: Pharmacist

## 2024-01-21 ENCOUNTER — Other Ambulatory Visit (HOSPITAL_COMMUNITY): Payer: Self-pay

## 2024-01-23 ENCOUNTER — Other Ambulatory Visit (HOSPITAL_COMMUNITY): Payer: Self-pay

## 2024-04-15 ENCOUNTER — Ambulatory Visit: Admitting: Family Medicine
